# Patient Record
Sex: Female | Born: 1935 | Race: White | Hispanic: No | Smoking: Never smoker
Health system: Southern US, Community
[De-identification: ages and names within clinical notes are randomized; demographics above are authoritative.]

## PROBLEM LIST (undated history)

## (undated) DIAGNOSIS — Z8719 Personal history of other diseases of the digestive system: Secondary | ICD-10-CM

## (undated) DIAGNOSIS — IMO0002 Reserved for concepts with insufficient information to code with codable children: Secondary | ICD-10-CM

## (undated) DIAGNOSIS — R42 Dizziness and giddiness: Secondary | ICD-10-CM

## (undated) DIAGNOSIS — Z950 Presence of cardiac pacemaker: Secondary | ICD-10-CM

## (undated) DIAGNOSIS — E785 Hyperlipidemia, unspecified: Secondary | ICD-10-CM

## (undated) DIAGNOSIS — I10 Essential (primary) hypertension: Secondary | ICD-10-CM

## (undated) DIAGNOSIS — I4891 Unspecified atrial fibrillation: Secondary | ICD-10-CM

## (undated) DIAGNOSIS — M1990 Unspecified osteoarthritis, unspecified site: Secondary | ICD-10-CM

## (undated) DIAGNOSIS — K648 Other hemorrhoids: Secondary | ICD-10-CM

## (undated) DIAGNOSIS — I639 Cerebral infarction, unspecified: Secondary | ICD-10-CM

## (undated) DIAGNOSIS — D649 Anemia, unspecified: Secondary | ICD-10-CM

## (undated) DIAGNOSIS — M3500 Sicca syndrome, unspecified: Secondary | ICD-10-CM

## (undated) DIAGNOSIS — E559 Vitamin D deficiency, unspecified: Secondary | ICD-10-CM

## (undated) DIAGNOSIS — R55 Syncope and collapse: Secondary | ICD-10-CM

## (undated) DIAGNOSIS — I495 Sick sinus syndrome: Secondary | ICD-10-CM

## (undated) DIAGNOSIS — K219 Gastro-esophageal reflux disease without esophagitis: Secondary | ICD-10-CM

## (undated) DIAGNOSIS — E039 Hypothyroidism, unspecified: Secondary | ICD-10-CM

## (undated) DIAGNOSIS — Z9889 Other specified postprocedural states: Secondary | ICD-10-CM

## (undated) DIAGNOSIS — M810 Age-related osteoporosis without current pathological fracture: Secondary | ICD-10-CM

## (undated) DIAGNOSIS — G8929 Other chronic pain: Secondary | ICD-10-CM

## (undated) DIAGNOSIS — K2270 Barrett's esophagus without dysplasia: Secondary | ICD-10-CM

## (undated) HISTORY — PX: CHOLECYSTECTOMY: SHX55

## (undated) HISTORY — DX: Other hemorrhoids: K64.8

## (undated) HISTORY — PX: ABDOMINAL HYSTERECTOMY: SHX81

## (undated) HISTORY — DX: Vitamin D deficiency, unspecified: E55.9

## (undated) HISTORY — PX: KNEE ARTHROSCOPY: SUR90

## (undated) HISTORY — DX: Barrett's esophagus without dysplasia: K22.70

## (undated) HISTORY — DX: Age-related osteoporosis without current pathological fracture: M81.0

## (undated) HISTORY — DX: Dizziness and giddiness: R42

## (undated) HISTORY — DX: Sick sinus syndrome: I49.5

## (undated) HISTORY — PX: BILATERAL OOPHORECTOMY: SHX1221

## (undated) HISTORY — PX: EYE SURGERY: SHX253

## (undated) HISTORY — DX: Reserved for concepts with insufficient information to code with codable children: IMO0002

## (undated) HISTORY — DX: Hyperlipidemia, unspecified: E78.5

## (undated) HISTORY — DX: Presence of cardiac pacemaker: Z95.0

## (undated) HISTORY — DX: Unspecified atrial fibrillation: I48.91

## (undated) HISTORY — PX: TONSILLECTOMY: SUR1361

## (undated) HISTORY — PX: APPENDECTOMY: SHX54

## (undated) HISTORY — DX: Cerebral infarction, unspecified: I63.9

## (undated) HISTORY — DX: Other chronic pain: G89.29

## (undated) HISTORY — DX: Syncope and collapse: R55

---

## 2008-10-04 DIAGNOSIS — R55 Syncope and collapse: Secondary | ICD-10-CM

## 2008-10-04 HISTORY — DX: Syncope and collapse: R55

## 2008-10-04 HISTORY — PX: CARDIAC CATHETERIZATION: SHX172

## 2008-10-04 HISTORY — PX: PACEMAKER INSERTION: SHX728

## 2012-01-06 ENCOUNTER — Encounter: Payer: Self-pay | Admitting: Physical Medicine & Rehabilitation

## 2012-02-22 ENCOUNTER — Encounter: Payer: Medicare Other | Attending: Physical Medicine & Rehabilitation | Admitting: Physical Medicine & Rehabilitation

## 2012-02-22 ENCOUNTER — Encounter: Payer: Self-pay | Admitting: Physical Medicine & Rehabilitation

## 2012-02-22 VITALS — BP 168/80 | HR 72 | Resp 16 | Ht 60.0 in | Wt 152.0 lb

## 2012-02-22 DIAGNOSIS — M3500 Sicca syndrome, unspecified: Secondary | ICD-10-CM | POA: Insufficient documentation

## 2012-02-22 DIAGNOSIS — M19071 Primary osteoarthritis, right ankle and foot: Secondary | ICD-10-CM | POA: Insufficient documentation

## 2012-02-22 DIAGNOSIS — M545 Low back pain, unspecified: Secondary | ICD-10-CM | POA: Insufficient documentation

## 2012-02-22 DIAGNOSIS — M7918 Myalgia, other site: Secondary | ICD-10-CM | POA: Insufficient documentation

## 2012-02-22 DIAGNOSIS — M2170 Unequal limb length (acquired), unspecified site: Secondary | ICD-10-CM | POA: Insufficient documentation

## 2012-02-22 DIAGNOSIS — IMO0001 Reserved for inherently not codable concepts without codable children: Secondary | ICD-10-CM

## 2012-02-22 DIAGNOSIS — M47816 Spondylosis without myelopathy or radiculopathy, lumbar region: Secondary | ICD-10-CM

## 2012-02-22 DIAGNOSIS — G8929 Other chronic pain: Secondary | ICD-10-CM | POA: Insufficient documentation

## 2012-02-22 DIAGNOSIS — M76899 Other specified enthesopathies of unspecified lower limb, excluding foot: Secondary | ICD-10-CM

## 2012-02-22 DIAGNOSIS — M7060 Trochanteric bursitis, unspecified hip: Secondary | ICD-10-CM | POA: Insufficient documentation

## 2012-02-22 DIAGNOSIS — M19079 Primary osteoarthritis, unspecified ankle and foot: Secondary | ICD-10-CM

## 2012-02-22 DIAGNOSIS — M47817 Spondylosis without myelopathy or radiculopathy, lumbosacral region: Secondary | ICD-10-CM | POA: Insufficient documentation

## 2012-02-22 DIAGNOSIS — M19072 Primary osteoarthritis, left ankle and foot: Secondary | ICD-10-CM

## 2012-02-22 MED ORDER — DICLOFENAC SODIUM 1 % TD GEL: 1.0000 "application " | Freq: Four times a day (QID) | TRANSDERMAL | Status: DC

## 2012-02-22 NOTE — Progress Notes (Signed)
Subjective:    Patient ID: Tonya Hammond, female    DOB: Aug 25, 1927, 78 y.o.   MRN: [id number]  HPI  This is a pleasant 78 yo female who has a long history of low back pain.  She remembers that in her 20's she was found to have an abnormality in her low back. She was able to manage the sx conservatively with exercise, stretching etc until a 5 or 6 years ago.  Her last MRI was from about 5or6 years ago. She has had a CT scan apparently since then. She's had multiple back injections over the last few years. The last one she had was from 3 years ago.  They had to stop these because she had an anapyhlactic reeaction.   Her pain is in her left low back to the left buttock. It is a burning, popping, cracking sensation.  She has a hard time sleeping on that area, and that area sometimes is tender to touch. The area is painful when she flexes her right. Prolonged sitting makes the pain better.  Standing bothers it more than walking. She's limited in walking dx due to foot pain and OA. She's had buniectomy in the past. She reports OA in her ankle as well. She has had home therapy recently.   For relief, she uses ice, biofreeze, rest.  Lidoderm has helped in the past. She used a TENS unit in the past but it doesn't seem to work as well.  She likes warm water, and daily stretching (15 minutes in the am). She uses a Museum/gallery exhibitions officer for longer dx ambulation.   She is on hydrocodone every 6 hours at home. It does help with her pain but she needs it every 6 hours or she's in pain.  She's tried oxycodone but had intolerance.  Fentanyl caused breathing difficulties. She stopped cymbalta due to GI upset.   She does have a history of bradycardia and an arrhythmia. She had a pacer placed 3 years ago which has helped.  She was dx'ed with Sjogren's Syndrome in 1988. She's had arthopathy, dry membranes, eye complications as a result.   Her husband is at home under hospice care currently.     Pain Inventory Average Pain  5 Pain Right Now 5 My pain is sharp, stabbing and aching  In the last 24 hours, has pain interfered with the following? General activity 4 Relation with others 3 Enjoyment of life 4 What TIME of day is your pain at its worst? evening Sleep (in general) Good  Pain is worse with: walking, bending, sitting, inactivity and standing Pain improves with: rest, heat/ice, therapy/exercise and medication Relief from Meds: 7  Mobility walk with assistance use a cane use a walker how many minutes can you walk? 15 ability to climb steps?  yes do you drive?  no  Function not employed: date last employed 10/03/1982 I need assistance with the following:  meal prep, household duties and shopping  Neuro/Psych bladder control problems weakness trouble walking dizziness confusion depression anxiety  Prior Studies Any changes since last visit?  no NEW PATIENT  Physicians involved in your care Any changes since last visit?  no Primary care Dr Joycelyn Rua Dr Vilinda Boehringer      Review of Systems  Constitutional:       Poor appetite  Respiratory: Positive for shortness of breath.        Infections  Gastrointestinal: Positive for diarrhea and constipation.  Genitourinary: Positive for difficulty urinating.  Musculoskeletal: Positive for gait  problem.  Neurological: Positive for dizziness and weakness.  Psychiatric/Behavioral: Positive for confusion and dysphoric mood. The patient is nervous/anxious.   All other systems reviewed and are negative.       Objective:   Physical Exam  Constitutional: She is oriented to person, place, and time. She appears well-developed and well-nourished.  HENT:  Head: Atraumatic.  Eyes: Conjunctivae and EOM are normal. Pupils are equal, round, and reactive to light.  Cardiovascular: Normal rate, regular rhythm and normal heart sounds.   Pulmonary/Chest: Breath sounds normal. No respiratory distress. She has no wheezes.  Abdominal: Bowel  sounds are normal. She exhibits no distension. There is no tenderness.  Musculoskeletal:       Elevation of the left hemi pelvis over the right about by 3/4 inch.  Leg length discrepancy on the right was not equal to that and closer to 1/4 to 1/2 inch.    Mild dextroscoliosis of the lumbar-thoracic spine was seen.  She was quite tender to touch throuhout the lumbar paraspinals, psis areas, spinous processes, etc.  Greater trochanters were tender to touch especially on the right.   Posture was fair. She seem to be antalgic on the left leg.    Significant pes planus of right foot. Left foot arch is preserved. Toes are crossed on both feet, especially the 2nd one on the right and the 4th toe on the left.   Neurological: She is alert and oriented to person, place, and time. No cranial nerve deficit.       Pain inhibition weakness in the legs. Otherwise motor and sensory exam is normal  Psychiatric: She has a normal mood and affect. Her behavior is normal. Thought content normal.          Assessment & Plan:  1.Chronic low back pain related to spondylosis, myofascial pain, postural issues, right leg length discrepancy combined with pelvic rotation clock wise.  2. Sjogren's Syndrome  3. "Familial" spinal abnormality  4. Multiple medication allergies  5. 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 Outpatient therapy would be the best approach here. This is a cumulative problem, and there is no "cure" so to speak, but i think we can improve her quality of life. She needs to work on strengthening her core, balancing out her LE musculature, pelvis, and low back as well.  I think it would be helpful to find some modalities which provide pain relief also.  She needs to address a home exercise program also. 2. Trial of voltaren gel to her hips, feet, low back. 3. Advised her to resume the wear of a 1/4 heel insert on the right shoe. I do think that pelvic  rotation is playing a part in her clinical discrepancy as well. 4. She may continue with hydrocodone for pain. She is using q6 hours. Consider the use of a long acting agent.  A UDS sample was collected today per protocol. 5. All questions were encouraged and answered. The patient will see me back in one month.  Need to acquire files from [facility]

## 2012-02-22 NOTE — Patient Instructions (Signed)
Continue range of motion and stretching on a daily basis

## 2012-03-03 ENCOUNTER — Telehealth: Payer: Self-pay

## 2012-03-03 NOTE — Telephone Encounter (Signed)
Pt would like to know status of prior auth for Voltaren gel.

## 2012-03-06 NOTE — Telephone Encounter (Signed)
Prior Tonya Hammond has been initiated, pt aware.

## 2012-03-22 ENCOUNTER — Telehealth: Payer: Self-pay | Admitting: Physical Medicine & Rehabilitation

## 2012-03-22 MED ORDER — HYDROCODONE-ACETAMINOPHEN 7.5-325 MG PO TABS: 1.0000 | ORAL_TABLET | Freq: Four times a day (QID) | ORAL | Status: DC | PRN

## 2012-03-22 NOTE — Telephone Encounter (Signed)
Had to reschedule to 03/29/12, wants meds changed, but only has enough to get her through Saturday.

## 2012-03-22 NOTE — Telephone Encounter (Signed)
Pt is need a refill on Hydrocodone. Made pt aware that we don't make changes over the phone to medications, I will call in her current dose of Hydrocodone in. UDS was consistent.

## 2012-03-22 NOTE — Telephone Encounter (Signed)
Tried calling pt, didn't get an answer.

## 2012-03-24 ENCOUNTER — Ambulatory Visit: Payer: Self-pay | Admitting: Physical Medicine & Rehabilitation

## 2012-03-29 ENCOUNTER — Encounter: Payer: Medicare Other | Attending: Physical Medicine & Rehabilitation | Admitting: Physical Medicine & Rehabilitation

## 2012-03-29 ENCOUNTER — Encounter: Payer: Self-pay | Admitting: Physical Medicine & Rehabilitation

## 2012-03-29 VITALS — BP 130/50 | HR 78 | Resp 14 | Ht 60.0 in | Wt 150.0 lb

## 2012-03-29 DIAGNOSIS — IMO0001 Reserved for inherently not codable concepts without codable children: Secondary | ICD-10-CM | POA: Insufficient documentation

## 2012-03-29 DIAGNOSIS — M3500 Sicca syndrome, unspecified: Secondary | ICD-10-CM | POA: Insufficient documentation

## 2012-03-29 DIAGNOSIS — M47816 Spondylosis without myelopathy or radiculopathy, lumbar region: Secondary | ICD-10-CM

## 2012-03-29 DIAGNOSIS — M7060 Trochanteric bursitis, unspecified hip: Secondary | ICD-10-CM

## 2012-03-29 DIAGNOSIS — M47817 Spondylosis without myelopathy or radiculopathy, lumbosacral region: Secondary | ICD-10-CM

## 2012-03-29 DIAGNOSIS — M76899 Other specified enthesopathies of unspecified lower limb, excluding foot: Secondary | ICD-10-CM | POA: Insufficient documentation

## 2012-03-29 DIAGNOSIS — M7918 Myalgia, other site: Secondary | ICD-10-CM

## 2012-03-29 DIAGNOSIS — M19079 Primary osteoarthritis, unspecified ankle and foot: Secondary | ICD-10-CM | POA: Insufficient documentation

## 2012-03-29 DIAGNOSIS — M19071 Primary osteoarthritis, right ankle and foot: Secondary | ICD-10-CM

## 2012-03-29 MED ORDER — HYDROCODONE-ACETAMINOPHEN 7.5-325 MG PO TABS: 1.0000 | ORAL_TABLET | Freq: Four times a day (QID) | ORAL | Status: DC | PRN

## 2012-03-29 MED ORDER — MORPHINE SULFATE ER 15 MG PO TBCR: 15.0000 mg | EXTENDED_RELEASE_TABLET | Freq: Two times a day (BID) | ORAL | Status: DC

## 2012-03-29 NOTE — Patient Instructions (Signed)
PLEASE TRY ONLY ONE MORPHINE CONTROLLED RELEASE TABLET DAILY FOR THE FIRST WEEK TO MAKE SURE YOU CAN TOLERATE IT  CONTINUE WITH STRETCHING, GOOD POSTURE, AND EXERCISE AT HOME!

## 2012-03-29 NOTE — Progress Notes (Signed)
Subjective:    Patient ID: Tonya Hammond, female    DOB: 09/28/1927, 78 y.o.   MRN: [id number]  HPI The patient is back regarding her low back pain. She states she was a bit frustrated as I didn't make any changes to her medication regimen at last visit. We did attempt to prescribe her Voltaren gel, but insurance would not cover this. She did not appeal this. We also did not prescribe narcotics per our protocol and first visit.  Patient did go to physical therapy to work on lumbar range of motion strength lengthening and modalities. This seems to have been quite beneficial for her, as she's had improved with mobility and really pain control overall. She also found it beneficial to use the right shoe lift of 1/4 inches.  She remains on her hydrocodone 7.5/325 one 4 times a day. This seems to be the only thing that she can tolerate and that helps her pain. She tells me that she's tried multiple medications in the past including oral NSAIDs, oxycodone, fentanyl patch. She has been unable to tolerate any of these due to side effects.   Pain Inventory Average Pain 3 Pain Right Now 3 My pain is constant, burning, stabbing and aching  In the last 24 hours, has pain interfered with the following? General activity 5 Relation with others 5 Enjoyment of life 5 What TIME of day is your pain at its worst? evening Sleep (in general) Fair  Pain is worse with: bending, inactivity and standing Pain improves with: heat/ice and medication Relief from Meds: 5  Mobility use a cane use a walker how many minutes can you walk? 10 ability to climb steps?  yes do you drive?  no  Function retired I need assistance with the following:  meal prep, household duties and shopping  Neuro/Psych bladder control problems weakness trouble walking spasms dizziness confusion anxiety  Prior Studies Any changes since last visit?  no  Physicians involved in your care Any changes since last visit?   no   Family History  Problem Relation Age of Onset  . Heart disease Mother     CHF  . Depression Mother   . Arthritis Mother   . Cancer Father   . Alcohol abuse Father   . Heart disease Father   . Hypertension Father   . Arthritis Father   . Cancer Sister   . Hypertension Sister   . Stroke Paternal Grandmother    History   Social History  . Marital Status: Unknown    Spouse Name: N/A    Number of Children: N/A  . Years of Education: N/A   Social History Main Topics  . Smoking status: Never Smoker   . Smokeless tobacco: Never Used  . Alcohol Use: None  . Drug Use: None  . Sexually Active: None   Other Topics Concern  . None   Social History Narrative  . None   Past Surgical History  Procedure Date  . Appendectomy   . Knee arthroscopy   . Eye surgery   . Cholecystectomy   . Abdominal hysterectomy   . Tonsillectomy    History reviewed. No pertinent past medical history. BP 130/50  Pulse 78  Resp 14  Ht 5' (1.524 m)  Wt 150 lb (68.04 kg)  BMI 29.30 kg/m2  SpO2 97%      Review of Systems  Constitutional: Positive for chills and fatigue.  HENT: Negative.   Eyes: Negative.   Respiratory: Negative.   Cardiovascular: Negative.  Gastrointestinal: Positive for nausea, diarrhea and constipation.  Genitourinary: Negative.   Musculoskeletal: Negative.   Skin: Positive for rash.  Neurological: Negative.   Hematological: Bruises/bleeds easily.  Psychiatric/Behavioral: Positive for confusion.       Anxiety       Objective:   Physical Exam Constitutional: She is oriented to person, place, and time. She appears well-developed and well-nourished.  HENT:  Head: Atraumatic.  Eyes: Conjunctivae and EOM are normal. Pupils are equal, round, and reactive to light.  Cardiovascular: Normal rate, regular rhythm and normal heart sounds.  Pulmonary/Chest: Breath sounds normal. No respiratory distress. She has no wheezes.  Abdominal: Bowel sounds are normal. She  exhibits no distension. There is no tenderness.  Musculoskeletal:  Elevation of the left hemi pelvis over the right about by 3/4 inch. Leg length discrepancy on the right was not equal to that and closer to 1/4 to 1/2 inch. When she wore her shoe lift today her pelvis was much more symmetrical.   Mild dextroscoliosis of the lumbar-thoracic spine was seen.  She was less tender to touch throuhout the lumbar paraspinals, psis areas, spinous processes, etc. Greater trochanters were tender to touch especially on the right. The patient was able to bend to 90 with minimal discomfort. She had some discomfort with lateral bending and twisting to the right today. Overall her flexibility and range of motion was much improved. At that she stood up much more easily from the chair as well.   Posture was fair. She seem to be antalgic on the left leg to a certain extent.   Significant pes planus of right foot. Left foot arch is preserved. Toes are crossed on both feet, especially the 2nd one on the right and the 4th toe on the left.  Neurological: She is alert and oriented to person, place, and time. No cranial nerve deficit.    motor and sensory exam is unremarkable.  Psychiatric: She has a normal mood and affect. Her behavior is normal. Thought content normal.  Assessment & Plan:   1.Chronic low back pain related to spondylosis, myofascial pain, postural issues, right leg length discrepancy combined with pelvic rotation clock wise.  2. Sjogren's Syndrome  3. "Familial" spinal abnormality  4. Multiple medication allergies  5. 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 I understand that the patient has a lot of issues at home concerning her health of her husband and other family members. She feels that it's a bit of a hardship to continue going to physical therapy and that she has learned what she needs to learn. She has felt that therapy has been quite beneficial. I  expressed to her the importance of continuing with a regular home exercise program to continue building strength and to maintain length and posture as well. 2. Trial of voltaren gel to her hips, feet, low back. We will attempt to appeal the denial of her Voltaren gel as she is unable to tolerate any oral NSAIDs given her history of allergies as well as medical history. 3. Continue to wear a 1/4 heel insert on the right shoe.  4. She may continue with hydrocodone for pain. She is using q6 hours. 5. Will begin a trial of low dose MS Contin. Begin at 15mg  qday for one week and increase to q12 if needed and tolerated. 6. All questions were encouraged and answered. The patient will see me back in one month. I think overall the patient  has progressed since last month.

## 2012-04-28 ENCOUNTER — Encounter: Payer: Medicare Other | Attending: Physical Medicine & Rehabilitation | Admitting: Physical Medicine & Rehabilitation

## 2012-04-28 ENCOUNTER — Encounter: Payer: Self-pay | Admitting: Physical Medicine & Rehabilitation

## 2012-04-28 VITALS — BP 149/70 | HR 67 | Resp 16 | Ht 60.0 in | Wt 154.0 lb

## 2012-04-28 DIAGNOSIS — M47817 Spondylosis without myelopathy or radiculopathy, lumbosacral region: Secondary | ICD-10-CM | POA: Insufficient documentation

## 2012-04-28 DIAGNOSIS — M47816 Spondylosis without myelopathy or radiculopathy, lumbar region: Secondary | ICD-10-CM

## 2012-04-28 DIAGNOSIS — M19071 Primary osteoarthritis, right ankle and foot: Secondary | ICD-10-CM

## 2012-04-28 DIAGNOSIS — IMO0001 Reserved for inherently not codable concepts without codable children: Secondary | ICD-10-CM | POA: Insufficient documentation

## 2012-04-28 DIAGNOSIS — M7060 Trochanteric bursitis, unspecified hip: Secondary | ICD-10-CM

## 2012-04-28 DIAGNOSIS — M7918 Myalgia, other site: Secondary | ICD-10-CM

## 2012-04-28 DIAGNOSIS — M19079 Primary osteoarthritis, unspecified ankle and foot: Secondary | ICD-10-CM | POA: Insufficient documentation

## 2012-04-28 DIAGNOSIS — M3500 Sicca syndrome, unspecified: Secondary | ICD-10-CM | POA: Insufficient documentation

## 2012-04-28 DIAGNOSIS — M76899 Other specified enthesopathies of unspecified lower limb, excluding foot: Secondary | ICD-10-CM | POA: Insufficient documentation

## 2012-04-28 MED ORDER — MORPHINE SULFATE ER 15 MG PO TBCR: 15.0000 mg | EXTENDED_RELEASE_TABLET | Freq: Two times a day (BID) | ORAL | Status: DC

## 2012-04-28 NOTE — Patient Instructions (Signed)
CONTINUE WITH YOUR HOME EXERCISE PROGRAM  TRY 500-1000MG  OF ACETAMINOPHEN AT NIGHT FOR BREAKTHROUGH PAIN  IF YOUR PAIN IS MORE SEVERE AT NIGHT, TRY 1/2 TO 1 TAB OF HYDROCODONE  CALL ME WITH ANY QUESTIONS

## 2012-04-28 NOTE — Progress Notes (Signed)
Subjective:    Patient ID: Tonya Hammond, female    DOB: [birth year], 78 y.o.   MRN: [id number]  HPI  Tonya Hammond is back regarding her low back pain. She had a fall about a week and half ago when she was walking in her yard while some septic work was being done. It sounds as if her dog ran underneath of her and she fell against the steps. She was taken to the doctor and fortunately only suffered bruises and lacerations. Her pain has been a little more severe since the fall but is still improved from where it was a couple months ago.  We started her on Tonya Hammond 15 q12 at last visit. She initially tried once a day dosing, but after one week increased to q12. This seemed to help quite a bit. She has used tylenol or norco for breakthrough pain at night. She prefers not to take the norco, because it tends to make her groggy. The tylenol, however, hasn't always beenenough to hold the pain since she fell however.   She continues with her exercises and she feels that the strength she gained from doing these helped her a lot after she fell.   Pain Inventory Average Pain 4 Pain Right Now 4 My pain is intermittent, sharp, burning and aching  In the last 24 hours, has pain interfered with the following? General activity 4 Relation with others 5 Enjoyment of life 6 What TIME of day is your pain at its worst? morning and evening Sleep (in general) Fair  Pain is worse with: walking, bending and standing Pain improves with: heat/ice, therapy/exercise, pacing activities and medication Relief from Meds: 5  Mobility walk with assistance use a walker how many minutes can you walk? 15 ability to climb steps?  yes do you drive?  no Do you have any goals in this area?  no  Function retired I need assistance with the following:  meal prep, household duties and shopping  Neuro/Psych bladder control problems weakness numbness dizziness confusion anxiety  Prior Studies Any changes since  last visit?  no  Physicians involved in your care Any changes since last visit?  no   Family History  Problem Relation Age of Onset  . Heart disease Mother     CHF  . Depression Mother   . Arthritis Mother   . Cancer Father   . Alcohol abuse Father   . Heart disease Father   . Hypertension Father   . Arthritis Father   . Cancer Sister   . Hypertension Sister   . Stroke Paternal Grandmother    History   Social History  . Marital Status: Unknown    Spouse Name: N/A    Number of Children: N/A  . Years of Education: N/A   Social History Main Topics  . Smoking status: Never Smoker   . Smokeless tobacco: Never Used  . Alcohol Use: None  . Drug Use: None  . Sexually Active: None   Other Topics Concern  . None   Social History Narrative  . None   Past Surgical History  Procedure Date  . Appendectomy   . Knee arthroscopy   . Eye surgery   . Cholecystectomy   . Abdominal hysterectomy   . Tonsillectomy    History reviewed. No pertinent past medical history. BP 149/70  Pulse 67  Resp 16  Ht 5' (1.524 m)  Wt 154 lb (69.854 kg)  BMI 30.08 kg/m2  SpO2 99%  Review of Systems  Respiratory: Positive for shortness of breath.   Gastrointestinal: Positive for constipation.  Musculoskeletal: Positive for myalgias, back pain and arthralgias.  Neurological: Positive for dizziness, weakness and numbness.  Psychiatric/Behavioral: The patient is nervous/anxious.   All other systems reviewed and are negative.       Objective:   Physical Exam Constitutional: She is oriented to person, place, and time. She appears well-developed and well-nourished.  HENT:  Head: Atraumatic.  Eyes: Conjunctivae and EOM are normal. Pupils are equal, round, and reactive to light.  Cardiovascular: Normal rate, regular rhythm and normal heart sounds.  Pulmonary/Chest: Breath sounds normal. No respiratory distress. She has no wheezes.  Abdominal: Bowel sounds are normal. She exhibits  no distension. There is no tenderness.  Musculoskeletal:  Elevation of the left hemi pelvis over the right about by 3/4 inch. Leg length discrepancy on the right was not equal to that and closer to 1/4 to 1/2 inch. When she wore her shoe lift today her pelvis was much more symmetrical.   Mild dextroscoliosis of the lumbar-thoracic spine was seen.  She was less tender to touch throuhout the lumbar paraspinals, psis areas, spinous processes, etc. Greater trochanters were tender to touch especially on the right. The patient was able to bend to 90 with minimal discomfort. She had some discomfort with lateral bending and twisting to the right today. Overall her flexibility and range of motion was much improved. At that she stood up much more easily from the chair as well.   Posture was fair. She seem to be antalgic on the left leg to a certain extent.  She does occasionally lose her balance posteriorly. She walks quite nicely with the walker.   Significant pes planus of right foot. Left foot arch is preserved. Toes are crossed on both feet, especially the 2nd one on the right and the 4th toe on the left.  Neurological: She is alert and oriented to person, place, and time. No cranial nerve deficit.  motor and sensory exam is unremarkable.  Psychiatric: She has a normal mood and affect. Her behavior is normal. Thought content normal.  Skin: notable for multiple bruises on the left arm, right arm and legs. She has a laceration on the right forearm which is dressed.  Assessment & Plan:   1.Chronic low back pain related to spondylosis, myofascial pain, postural issues, right leg length discrepancy combined with pelvic rotation clock wise.  2. Sjogren's Syndrome  3. "Familial" spinal abnormality  4. Multiple medication allergies  5. 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 Overall, Tonya Hammond has done quite nicely with the Tonya Hammond. I think it would be most  appropriate to stay at the 15mg  q12 hr dosing schedule.  I recommended trying 500-1000mg  of tylenol in the evenings if she needs it for breakthrough pain. She may use a 1/2 to 1 norco 7.5 if her pain is more severe. She was feeling quite well until she had the fall, and even after the fall is feeling better than she had previously.  2. Encouraged her to continue with her ROM, Strengthening, and postural exercises. In my opinion these are the key to her maintaining adequate control of her pain as well as her balance. 3. She is quite concerned over the cost and inconvenience of coming to our clinic for visits. She would prefer at this point to have her family physician, Dr. Silvestre Moment, take over her pain  medication. I told her this would be fine, as long as he was ok with assuming responsibilities of these meds. 4. For now, I will see her back on an as needed basis.  All questions were encouraged and answered.

## 2012-10-19 ENCOUNTER — Encounter (HOSPITAL_COMMUNITY): Payer: Self-pay | Admitting: Internal Medicine

## 2012-10-19 ENCOUNTER — Emergency Department (HOSPITAL_COMMUNITY): Payer: Medicare Other

## 2012-10-19 ENCOUNTER — Observation Stay (HOSPITAL_COMMUNITY)
Admission: EM | Admit: 2012-10-19 | Discharge: 2012-10-20 | Disposition: A | Discharge status: Home / Self Care | Payer: Medicare Other | Attending: Cardiology | Admitting: Cardiology

## 2012-10-19 DIAGNOSIS — I10 Essential (primary) hypertension: Secondary | ICD-10-CM | POA: Diagnosis present

## 2012-10-19 DIAGNOSIS — IMO0002 Reserved for concepts with insufficient information to code with codable children: Secondary | ICD-10-CM | POA: Insufficient documentation

## 2012-10-19 DIAGNOSIS — R55 Syncope and collapse: Secondary | ICD-10-CM

## 2012-10-19 DIAGNOSIS — Z79899 Other long term (current) drug therapy: Secondary | ICD-10-CM | POA: Insufficient documentation

## 2012-10-19 DIAGNOSIS — M4120 Other idiopathic scoliosis, site unspecified: Secondary | ICD-10-CM | POA: Insufficient documentation

## 2012-10-19 DIAGNOSIS — Z950 Presence of cardiac pacemaker: Secondary | ICD-10-CM | POA: Insufficient documentation

## 2012-10-19 DIAGNOSIS — K219 Gastro-esophageal reflux disease without esophagitis: Secondary | ICD-10-CM | POA: Insufficient documentation

## 2012-10-19 DIAGNOSIS — M7060 Trochanteric bursitis, unspecified hip: Secondary | ICD-10-CM

## 2012-10-19 DIAGNOSIS — E039 Hypothyroidism, unspecified: Secondary | ICD-10-CM | POA: Insufficient documentation

## 2012-10-19 DIAGNOSIS — M19071 Primary osteoarthritis, right ankle and foot: Secondary | ICD-10-CM

## 2012-10-19 DIAGNOSIS — I369 Nonrheumatic tricuspid valve disorder, unspecified: Secondary | ICD-10-CM

## 2012-10-19 DIAGNOSIS — R0602 Shortness of breath: Secondary | ICD-10-CM | POA: Insufficient documentation

## 2012-10-19 DIAGNOSIS — M7918 Myalgia, other site: Secondary | ICD-10-CM

## 2012-10-19 DIAGNOSIS — R079 Chest pain, unspecified: Principal | ICD-10-CM | POA: Insufficient documentation

## 2012-10-19 DIAGNOSIS — M3500 Sicca syndrome, unspecified: Secondary | ICD-10-CM | POA: Insufficient documentation

## 2012-10-19 DIAGNOSIS — K449 Diaphragmatic hernia without obstruction or gangrene: Secondary | ICD-10-CM | POA: Insufficient documentation

## 2012-10-19 DIAGNOSIS — M47816 Spondylosis without myelopathy or radiculopathy, lumbar region: Secondary | ICD-10-CM

## 2012-10-19 DIAGNOSIS — M129 Arthropathy, unspecified: Secondary | ICD-10-CM | POA: Insufficient documentation

## 2012-10-19 HISTORY — DX: Gastro-esophageal reflux disease without esophagitis: K21.9

## 2012-10-19 HISTORY — DX: Unspecified osteoarthritis, unspecified site: M19.90

## 2012-10-19 HISTORY — DX: Anemia, unspecified: D64.9

## 2012-10-19 HISTORY — DX: Sjogren syndrome, unspecified: M35.00

## 2012-10-19 HISTORY — DX: Essential (primary) hypertension: I10

## 2012-10-19 HISTORY — DX: Hypothyroidism, unspecified: E03.9

## 2012-10-19 HISTORY — DX: Personal history of other diseases of the digestive system: Z87.19

## 2012-10-19 HISTORY — DX: Other specified postprocedural states: Z98.890

## 2012-10-19 LAB — POCT I-STAT, CHEM 8
BUN: 20 mg/dL (ref 6–23)
Creatinine, Ser: 0.9 mg/dL (ref 0.50–1.10)
Potassium: 3.8 mEq/L (ref 3.5–5.1)
Sodium: 142 mEq/L (ref 135–145)
TCO2: 30 mmol/L (ref 0–100)

## 2012-10-19 LAB — LIPID PANEL
Cholesterol: 161 mg/dL (ref 0–200)
HDL: 63 mg/dL (ref >39)
Total CHOL/HDL Ratio: 2.6 RATIO

## 2012-10-19 LAB — CBC
Hemoglobin: 12.7 g/dL (ref 12.0–15.0)
Platelets: 154 10*3/uL (ref 150–400)
RBC: 4.08 MIL/uL (ref 3.87–5.11)
WBC: 6 10*3/uL (ref 4.0–10.5)

## 2012-10-19 LAB — TSH: TSH: 2.929 u[IU]/mL (ref 0.350–4.500)

## 2012-10-19 LAB — BASIC METABOLIC PANEL
CO2: 29 mEq/L (ref 19–32)
Calcium: 8.9 mg/dL (ref 8.4–10.5)
Chloride: 104 mEq/L (ref 96–112)
Glucose, Bld: 102 mg/dL — ABNORMAL HIGH (ref 70–99)
Potassium: 4 mEq/L (ref 3.5–5.1)
Sodium: 141 mEq/L (ref 135–145)

## 2012-10-19 LAB — PRO B NATRIURETIC PEPTIDE: Pro B Natriuretic peptide (BNP): 659.7 pg/mL — ABNORMAL HIGH (ref 0–450)

## 2012-10-19 LAB — GLUCOSE, CAPILLARY: Glucose-Capillary: 84 mg/dL (ref 70–99)

## 2012-10-19 MED ORDER — ENOXAPARIN SODIUM 40 MG/0.4ML ~~LOC~~ SOLN
40.0000 mg | Freq: Every day | SUBCUTANEOUS | Status: DC
  Administered 2012-10-19: 40 mg via SUBCUTANEOUS
  Filled 2012-10-19 (×2): qty 0.4

## 2012-10-19 MED ORDER — OMEGA-3 FATTY ACIDS 1000 MG PO CAPS: 1.0000 g | ORAL_CAPSULE | Freq: Every day | ORAL | Status: DC

## 2012-10-19 MED ORDER — LEVOTHYROXINE SODIUM 25 MCG PO TABS
25.0000 ug | ORAL_TABLET | Freq: Every day | ORAL | Status: DC
  Administered 2012-10-19 – 2012-10-20 (×2): 25 ug via ORAL
  Filled 2012-10-19 (×4): qty 1

## 2012-10-19 MED ORDER — ADULT MULTIVITAMIN W/MINERALS CH
1.0000 | ORAL_TABLET | Freq: Every day | ORAL | Status: DC
  Administered 2012-10-19 – 2012-10-20 (×2): 1 via ORAL
  Filled 2012-10-19 (×2): qty 1

## 2012-10-19 MED ORDER — LIDOCAINE 5 % EX PTCH
1.0000 | MEDICATED_PATCH | CUTANEOUS | Status: DC
  Administered 2012-10-20: 1 via TRANSDERMAL
  Filled 2012-10-19 (×2): qty 1

## 2012-10-19 MED ORDER — HYDROCODONE-ACETAMINOPHEN 7.5-325 MG PO TABS
1.0000 | ORAL_TABLET | Freq: Four times a day (QID) | ORAL | Status: DC | PRN
  Administered 2012-10-19 – 2012-10-20 (×2): 1 via ORAL
  Filled 2012-10-19 (×2): qty 1

## 2012-10-19 MED ORDER — ONDANSETRON HCL 4 MG/2ML IJ SOLN: 4.0000 mg | Freq: Four times a day (QID) | INTRAMUSCULAR | Status: DC | PRN

## 2012-10-19 MED ORDER — SENNA 8.6 MG PO TABS
2.0000 | ORAL_TABLET | Freq: Every day | ORAL | Status: DC
  Administered 2012-10-19: 17.2 mg via ORAL
  Filled 2012-10-19 (×2): qty 2

## 2012-10-19 MED ORDER — METOPROLOL SUCCINATE ER 50 MG PO TB24
50.0000 mg | ORAL_TABLET | Freq: Every day | ORAL | Status: DC
  Administered 2012-10-19 – 2012-10-20 (×2): 50 mg via ORAL
  Filled 2012-10-19 (×2): qty 1

## 2012-10-19 MED ORDER — PANTOPRAZOLE SODIUM 40 MG PO TBEC
40.0000 mg | DELAYED_RELEASE_TABLET | Freq: Every day | ORAL | Status: DC
  Administered 2012-10-19 – 2012-10-20 (×2): 40 mg via ORAL
  Filled 2012-10-19 (×2): qty 1

## 2012-10-19 MED ORDER — NITROGLYCERIN 0.4 MG SL SUBL: 0.4000 mg | SUBLINGUAL_TABLET | SUBLINGUAL | Status: DC | PRN

## 2012-10-19 MED ORDER — ALBUTEROL SULFATE HFA 108 (90 BASE) MCG/ACT IN AERS: 1.0000 | INHALATION_SPRAY | Freq: Four times a day (QID) | RESPIRATORY_TRACT | Status: DC | PRN

## 2012-10-19 MED ORDER — MORPHINE SULFATE ER 15 MG PO TBCR
15.0000 mg | EXTENDED_RELEASE_TABLET | Freq: Two times a day (BID) | ORAL | Status: DC
  Administered 2012-10-19 – 2012-10-20 (×3): 15 mg via ORAL
  Filled 2012-10-19 (×3): qty 1

## 2012-10-19 MED ORDER — RISAQUAD PO CAPS
1.0000 | ORAL_CAPSULE | Freq: Every day | ORAL | Status: DC
  Administered 2012-10-19 – 2012-10-20 (×2): 1 via ORAL
  Filled 2012-10-19 (×2): qty 1

## 2012-10-19 MED ORDER — PREDNISONE 5 MG PO TABS
5.0000 mg | ORAL_TABLET | Freq: Every day | ORAL | Status: DC
  Administered 2012-10-19 – 2012-10-20 (×2): 5 mg via ORAL
  Filled 2012-10-19 (×3): qty 1

## 2012-10-19 MED ORDER — ISOSORBIDE MONONITRATE ER 30 MG PO TB24
30.0000 mg | ORAL_TABLET | Freq: Every day | ORAL | Status: DC
  Administered 2012-10-19: 30 mg via ORAL
  Filled 2012-10-19 (×2): qty 1

## 2012-10-19 MED ORDER — ESTROGENS CONJUGATED 0.3 MG PO TABS
0.3000 mg | ORAL_TABLET | Freq: Every day | ORAL | Status: DC
  Administered 2012-10-19 – 2012-10-20 (×2): 0.3 mg via ORAL
  Filled 2012-10-19 (×2): qty 1

## 2012-10-19 MED ORDER — CLOPIDOGREL BISULFATE 75 MG PO TABS
75.0000 mg | ORAL_TABLET | Freq: Every day | ORAL | Status: DC
  Administered 2012-10-20: 75 mg via ORAL
  Filled 2012-10-19: qty 1

## 2012-10-19 MED ORDER — NITROGLYCERIN 0.4 MG SL SUBL
0.4000 mg | SUBLINGUAL_TABLET | SUBLINGUAL | Status: DC | PRN
  Administered 2012-10-19 (×2): 0.4 mg via SUBLINGUAL

## 2012-10-19 MED ORDER — OMEGA-3-ACID ETHYL ESTERS 1 G PO CAPS
1.0000 g | ORAL_CAPSULE | Freq: Every day | ORAL | Status: DC
  Administered 2012-10-20: 1 g via ORAL
  Filled 2012-10-19 (×2): qty 1

## 2012-10-19 MED ORDER — DOCUSATE SODIUM 100 MG PO CAPS
200.0000 mg | ORAL_CAPSULE | Freq: Every day | ORAL | Status: DC
  Administered 2012-10-19: 200 mg via ORAL
  Filled 2012-10-19 (×2): qty 2

## 2012-10-19 NOTE — ED Notes (Addendum)
Patient transported by [facility] from home.  Patient got up to go to bathroom and had a near syncopal episode.  States she was feeling weak.  Denies any loss of consciousness.  Patient has a demand pacemaker and a history of afib. Patient's EKG was initially NSR of 90 changed to show paced rhythm at 60 upon arrival to hospital.  CBG 104.   Patient currently states she feels funny in her head.

## 2012-10-19 NOTE — H&P (Signed)
Cardiology H&P  Primary Care Povider: Joycelyn Rua, MD Primary Cardiologist: Dr. Leeann Must with [facility] in [place]   HPI: [name] is a 79 y.o.female with HTN and h/o pacemaker for ?SSS who presents to the ED for an episode of CP and SOB that woke her from sleep.  He daughter says she called out for her and was somewhat confused, diaphoretic and very short of breath.  Her husband has a pulse ox and they put it on her to find her O2 sats in the mid 80s.  She used her husbands oxygen with improvement in her symptoms.  EMS was called and she was brought to the ED.  Her pain was eventually completely releived with 2 SL nitro.  Her initial pressure was in the 220s/100s via EMS per the daughter.  She is now feeling much better.  She has multple drug allergies and ASA gives her a rash.  She has had no fever or chills.  She has had no syncope but did feel dizzy during this episode tonight. I was called to evaluate due to concerning findings of possible ST elevation on rhythm strip from EMS.   Past Medical History  Diagnosis Date  . Pacemaker 2010    syncope  . Scoliosis   . Hypothyroidism   . HTN (hypertension)   . Sjogren's disease     Past Surgical History  Procedure Date  . Appendectomy   . Knee arthroscopy   . Eye surgery   . Cholecystectomy   . Abdominal hysterectomy   . Tonsillectomy     Family History  Problem Relation Age of Onset  . Heart disease Mother 41    CHF  . Depression Mother   . Arthritis Mother   . Cancer Father   . Alcohol abuse Father   . Heart disease Father   . Hypertension Father   . Arthritis Father   . Cancer Sister   . Hypertension Sister   . Stroke Paternal Grandmother     Social History:  reports that she has never smoked. She has never used smokeless tobacco. She reports that she does not drink alcohol. Her drug history not on file.  Allergies:  Allergies  Allergen Reactions  . Ace Inhibitors   . Amlodipine Anaphylaxis  .  Angiotensin Receptor Blockers   . Avelox (Moxifloxacin Hcl In Nacl)   . Azor (Amlodipine-Olmesartan)   . Cephalexin   . Ciprofloxacin Hcl   . Contrast Media (Iodinated Diagnostic Agents) Anaphylaxis  . Cortizone-10 (Hydrocortisone)     Can take prednisone  . Cymbalta (Duloxetine Hcl)   . Depo-Medrol (Methylprednisolone Acetate)     Can take prednisone  . Flagyl (Metronidazole)   . Penicillins   . Zantac (Ranitidine Hcl)   . Aspirin Rash  . Fentanyl Citrate   . Omnaris (Ciclesonide)   . Statins   . Wellbutrin (Bupropion) Hives    Current Facility-Administered Medications  Medication Dose Route Frequency Provider Last Rate Last Dose  . nitroGLYCERIN (NITROSTAT) SL tablet 0.4 mg  0.4 mg Sublingual Q5 min PRN April K Palumbo-Rasch, MD   0.4 mg at 10/19/12 1610   Current Outpatient Prescriptions  Medication Sig Dispense Refill  . acetaminophen (TYLENOL) 500 MG tablet Take 500 mg by mouth every 6 (six) hours as needed. But not with Norco      . albuterol (PROVENTIL HFA;VENTOLIN HFA) 108 (90 BASE) MCG/ACT inhaler Inhale 1-2 puffs into the lungs every 6 (six) hours as needed. Wheezing or shortness of breath      .  beta carotene w/minerals (OCUVITE) tablet Take 1 tablet by mouth daily.      . Calcium Citrate-Vitamin D (CITRACAL PETITES/VITAMIN D) 200-250 MG-UNIT TABS Take 2 tablets by mouth daily.      . cetirizine (ZYRTEC) 10 MG chewable tablet Chew 10 mg by mouth daily.      . chlorpheniramine (CHLOR-TRIMETON) 4 MG tablet Take 4 mg by mouth every 6 (six) hours as needed. For sinus congestion      . docusate sodium (COLACE) 100 MG capsule Take 200 mg by mouth at bedtime.       Marland Kitchen estrogens, conjugated, (PREMARIN) 0.3 MG tablet Take 0.3 mg by mouth daily.      . fish oil-omega-3 fatty acids 1000 MG capsule Take 1 g by mouth daily.      . furosemide (LASIX) 20 MG tablet Take 20 mg by mouth 2 (two) times daily as needed.      Marland Kitchen HYDROcodone-acetaminophen (NORCO) 7.5-325 MG per tablet Take 1  tablet by mouth every 6 (six) hours as needed.  120 tablet  0  . isosorbide mononitrate (IMDUR) 30 MG 24 hr tablet Take 1 tablet by mouth at bedtime.       Marland Kitchen levothyroxine (SYNTHROID, LEVOTHROID) 25 MCG tablet Take 25 mcg by mouth daily.      Marland Kitchen lidocaine (LIDODERM) 5 % Place 1 patch onto the skin daily. Remove & Discard patch within 12 hours or as directed by MD      . loratadine (CLARITIN) 10 MG tablet Take 10 mg by mouth daily.      . Menthol, Topical Analgesic, (BIOFREEZE) 4 % GEL Apply 1 application topically 4 (four) times daily as needed. Joint pain      . morphine (MS CONTIN) 15 MG 12 hr tablet Take 1 tablet (15 mg total) by mouth 2 (two) times daily.  60 tablet  0  . Multiple Vitamin (MULTIVITAMIN WITH MINERALS) TABS Take 1 tablet by mouth daily.      . Multiple Vitamin (MULTIVITAMIN) tablet Take 1 tablet by mouth daily.      . Multiple Vitamins-Minerals (HAIR/SKIN/NAILS PO) Take 1 tablet by mouth daily.      . Olopatadine HCl (PATADAY) 0.2 % SOLN Apply 1 drop to eye as needed.      Marland Kitchen omeprazole (PRILOSEC) 20 MG capsule Take 1 capsule by mouth Daily.      . predniSONE (DELTASONE) 5 MG tablet Take 1 tablet by mouth Daily.      . Probiotic Product (PROBIOTIC DAILY PO) Take 1 tablet by mouth daily.      Marland Kitchen senna (SENOKOT) 8.6 MG TABS Take 2 tablets by mouth at bedtime.      . Wheat Dextrin (BENEFIBER PO) Take 10 mLs by mouth daily. Fiber supplement        ROS: A full review of systems is obtained and is negative except as noted in the HPI.  Physical Exam: Blood pressure 127/63, pulse 65, temperature 98.4 F (36.9 C), temperature source Oral, resp. rate 17, height 5' (1.524 m), weight 68.04 kg (150 lb), SpO2 98.00%.  GENERAL: no acute distress.  EYES: Extra ocular movements are intact. There is no lid lag. Sclera is anicteric.  ENT: Oropharynx is clear. Dentition is within normal limits.  NECK: Supple. The thyroid is not enlarged.  LYMPH: There are no masses or lymphadenopathy present.    HEART: Regular rate and rhythm with no m/g/r.  Normal S1/S2. minimal JVD LUNGS: basilar crackles  ABDOMEN: Soft, non-tender, and non-distended with  normoactive bowel sounds. There is no hepatosplenomegaly.  EXTREMITIES: No clubbing, cyanosis, or edema.  PULSES:  DP/PT pulses were +2 and equal bilaterally.  SKIN: Warm, dry, and intact.  NEUROLOGIC: The patient was oriented to person, place, and time. No overt neurologic deficits were detected.  PSYCH: Normal judgment and insight, mood is appropriate.   Results: Results for orders placed during the hospital encounter of 10/19/12 (from the past 24 hour(s))  GLUCOSE, CAPILLARY     Status: Normal   Collection Time   10/19/12  3:17 AM      Component Value Range   Glucose-Capillary 84  70 - 99 mg/dL  CBC     Status: Normal   Collection Time   10/19/12  3:24 AM      Component Value Range   WBC 6.0  4.0 - 10.5 K/uL   RBC 4.08  3.87 - 5.11 MIL/uL   Hemoglobin 12.7  12.0 - 15.0 g/dL   HCT 16.1  09.6 - 04.5 %   MCV 93.6  78.0 - 100.0 fL   MCH 31.1  26.0 - 34.0 pg   MCHC 33.2  30.0 - 36.0 g/dL   RDW 40.9  81.1 - 91.4 %   Platelets 154  150 - 400 K/uL  BASIC METABOLIC PANEL     Status: Abnormal   Collection Time   10/19/12  3:24 AM      Component Value Range   Sodium 141  135 - 145 mEq/L   Potassium 4.0  3.5 - 5.1 mEq/L   Chloride 104  96 - 112 mEq/L   CO2 29  19 - 32 mEq/L   Glucose, Bld 102 (*) 70 - 99 mg/dL   BUN 18  6 - 23 mg/dL   Creatinine, Ser 7.82  0.50 - 1.10 mg/dL   Calcium 8.9  8.4 - 95.6 mg/dL   GFR calc non Af Amer 60 (*) >90 mL/min   GFR calc Af Amer 69 (*) >90 mL/min  POCT I-STAT, CHEM 8     Status: Normal   Collection Time   10/19/12  4:12 AM      Component Value Range   Sodium 142  135 - 145 mEq/L   Potassium 3.8  3.5 - 5.1 mEq/L   Chloride 103  96 - 112 mEq/L   BUN 20  6 - 23 mg/dL   Creatinine, Ser 2.13  0.50 - 1.10 mg/dL   Glucose, Bld 96  70 - 99 mg/dL   Calcium, Ion 0.86  5.78 - 1.30 mmol/L   TCO2 30  0 -  100 mmol/L   Hemoglobin 12.9  12.0 - 15.0 g/dL   HCT 46.9  62.9 - 52.8 %    EKG: NSR with PAC's, no ST changes CXR: clear EKG strip from EMS: only leads I, II, III -> ST changes were due to V pacing and do not represent STEMI  Assessment/Plan: 79 yo WF with HTN and pacemaker here with SOB/CP 1. SOB/CP: typical pain but suspect was due to hypertensive episode and flash pulmonary edema rather than true acute coronary syndrome - cannot take ASA (rash) so she has been loaded with plavix - no heparin unless enzyme positive - rule out for AMI with serial biomarkers - given central venous pressure looks fairly normal now that BP is down, will avoid IV diuresis for now - check echo in AM 2. HTN: - continue imdur 30 at night and toprol xl 50 daily - might consider adding norvasc 2.5 depending  on how BP looks throughout the day 3. Sjogren's: - continue chronic steroid 4. DVT ppx: - lovenox 40mg  daily   Robie Oats 10/19/2012, 4:28 AM

## 2012-10-19 NOTE — ED Provider Notes (Signed)
History     CSN: [id number]  Arrival date & time 10/19/12  0249   First MD Initiated Contact with Patient 10/19/12 0300      Chief Complaint  Patient presents with  . Near Syncope    (Consider location/radiation/quality/duration/timing/severity/associated sxs/prior treatment) Patient is a 79 y.o. female presenting with chest pain. The history is provided by the patient.  Chest Pain The chest pain began 1 - 2 hours ago. Chest pain occurs constantly. The chest pain is improving. The severity of the pain is mild. The quality of the pain is described as pressure-like. The pain radiates to the left shoulder. Primary symptoms include shortness of breath. Pertinent negatives for primary symptoms include no fever, no cough, no nausea and no vomiting.  Pertinent negatives for associated symptoms include no claudication. She tried oxygen for the symptoms. Risk factors include being elderly.   near syncopal and per her daughter she had poor color.    No past medical history on file.  Past Surgical History  Procedure Date  . Appendectomy   . Knee arthroscopy   . Eye surgery   . Cholecystectomy   . Abdominal hysterectomy   . Tonsillectomy     Family History  Problem Relation Age of Onset  . Heart disease Mother     CHF  . Depression Mother   . Arthritis Mother   . Cancer Father   . Alcohol abuse Father   . Heart disease Father   . Hypertension Father   . Arthritis Father   . Cancer Sister   . Hypertension Sister   . Stroke Paternal Grandmother     History  Substance Use Topics  . Smoking status: Never Smoker   . Smokeless tobacco: Never Used  . Alcohol Use: Not on file    OB History    Grav Para Term Preterm Abortions TAB SAB Ect Mult Living                  Review of Systems  Constitutional: Negative for fever.  Respiratory: Positive for shortness of breath. Negative for cough.   Cardiovascular: Positive for chest pain. Negative for claudication.    Gastrointestinal: Negative for nausea and vomiting.  All other systems reviewed and are negative.    Allergies  Ace inhibitors; Amlodipine; Angiotensin receptor blockers; Avelox; Azor; Cephalexin; Ciprofloxacin hcl; Contrast media; Cortizone-10; Cymbalta; Depo-medrol; Flagyl; Penicillins; Zantac; Aspirin; Fentanyl citrate; Omnaris; Statins; and Wellbutrin  Home Medications   Current Outpatient Rx  Name  Route  Sig  Dispense  Refill  . ACETAMINOPHEN 500 MG PO TABS   Oral   Take 500 mg by mouth every 6 (six) hours as needed. But not with Norco         . CETIRIZINE HCL 10 MG PO CHEW   Oral   Chew 10 mg by mouth daily.         . CHLORPHENIRAMINE MALEATE 4 MG PO TABS   Oral   Take 4 mg by mouth as needed.         Marland Kitchen DICLOFENAC SODIUM 1 % TD GEL   Topical   Apply 1 application topically 4 (four) times daily. Apply to feet, hands, back, hip   3 Tube   4   . DOCUSATE SODIUM 100 MG PO CAPS   Oral   Take 100 mg by mouth 2 (two) times daily as needed.         . OMEGA-3 FATTY ACIDS 1000 MG PO CAPS   Oral  Take 1 g by mouth daily.         . FUROSEMIDE 20 MG PO TABS   Oral   Take 20 mg by mouth 2 (two) times daily as needed.         Marland Kitchen HYDROCODONE-ACETAMINOPHEN 7.5-325 MG PO TABS   Oral   Take 1 tablet by mouth every 6 (six) hours as needed.   120 tablet   0   . ISOSORBIDE MONONITRATE ER 30 MG PO TB24   Oral   Take 1 tablet by mouth Daily.         Marland Kitchen LEVOTHYROXINE SODIUM 25 MCG PO TABS   Oral   Take 25 mcg by mouth daily.         Marland Kitchen LIDOCAINE 5 % EX PTCH   Transdermal   Place 1 patch onto the skin daily. Remove & Discard patch within 12 hours or as directed by MD         . LORATADINE 10 MG PO TABS   Oral   Take 10 mg by mouth daily.         . MORPHINE SULFATE ER 15 MG PO TBCR   Oral   Take 1 tablet (15 mg total) by mouth 2 (two) times daily.   60 tablet   0   . ONE-DAILY MULTI VITAMINS PO TABS   Oral   Take 1 tablet by mouth daily.          . OLOPATADINE HCL 0.2 % OP SOLN   Ophthalmic   Apply 1 drop to eye as needed.         Marland Kitchen OMEPRAZOLE 20 MG PO CPDR   Oral   Take 1 capsule by mouth Daily.         Marland Kitchen PREDNISONE 5 MG PO TABS   Oral   Take 1 tablet by mouth Daily.         Marland Kitchen PREMARIN 0.625 MG/GM VA CREA   Vaginal   Place 1 application vaginally every three (3) days as needed. May use q 3-4 days prn         . PROBIOTIC FORMULA PO   Oral   Take 1 capsule by mouth daily.         . TOPROL XL 25 MG PO TB24   Oral   Take 2 tablets by mouth Daily.         . VENTOLIN HFA 108 (90 BASE) MCG/ACT IN AERS   Inhalation   Inhale 2 puffs into the lungs.           BP 139/60  Pulse 68  Temp 98.4 F (36.9 C) (Oral)  Resp 18  Ht 5' (1.524 m)  Wt 150 lb (68.04 kg)  BMI 29.30 kg/m2  SpO2 99%  Physical Exam  Constitutional: She appears well-developed and well-nourished. No distress.  HENT:  Head: Normocephalic and atraumatic.  Mouth/Throat: Oropharynx is clear and moist.  Eyes: Conjunctivae normal are normal. Pupils are equal, round, and reactive to light.  Neck: Normal range of motion. Neck supple.  Cardiovascular: Normal rate, regular rhythm and intact distal pulses.   Pulmonary/Chest: Effort normal and breath sounds normal. She has no wheezes. She has no rales.  Abdominal: Soft. Bowel sounds are normal. There is no tenderness. There is no rebound and no guarding.  Musculoskeletal: Normal range of motion.  Neurological: She is alert.  Skin: Skin is warm and dry.  Psychiatric: She has a normal mood and affect.    ED Course  Procedures (including critical care time)   Labs Reviewed  GLUCOSE, CAPILLARY  CBC  BASIC METABOLIC PANEL   No results found.   No diagnosis found.    MDM   Date: 10/19/2012  Rate: 67  Rhythm: normal sinus rhythm  QRS Axis: normal  Intervals: PR prolonged  ST/T Wave abnormalities: normal  Conduction Disutrbances:first-degree A-V block   Narrative  Interpretation:   Old EKG Reviewed: none available    Pain free following 2 SL NTG     Lorali Khamis K Aydee Mcnew-Rasch, MD 10/19/12 [phone]

## 2012-10-19 NOTE — ED Notes (Signed)
Patient ambulated to restroom and tolerated well.  

## 2012-10-19 NOTE — Progress Notes (Signed)
  Echocardiogram 2D Echocardiogram has been performed.  Kesler Wickham FRANCES 10/19/2012, 3:34 PM

## 2012-10-19 NOTE — Progress Notes (Signed)
TELEMETRY: Reviewed telemetry pt in NSR: Filed Vitals:   10/19/12 0338 10/19/12 0345 10/19/12 0500 10/19/12 0626  BP: 139/60 127/63 113/50 139/53  Pulse: 68 65 67 64  Temp:    97.4 F (36.3 C)  TempSrc:    Oral  Resp: 18 17 20    Height:    5' (1.524 m)  Weight:    150 lb (68.04 kg)  SpO2: 99% 98% 96% 98%   No intake or output data in the 24 hours ending 10/19/12 0806  SUBJECTIVE No further chest pain or SOB. Feels well.  LABS: Basic Metabolic Panel:  Basename 10/19/12 0412 10/19/12 0324  NA 142 141  K 3.8 4.0  CL 103 104  CO2 -- 29  GLUCOSE 96 102*  BUN 20 18  CREATININE 0.90 0.87  CALCIUM -- 8.9  MG -- --  PHOS -- --   CBC:  Basename 10/19/12 0412 10/19/12 0324  WBC -- 6.0  NEUTROABS -- --  HGB 12.9 12.7  HCT 38.0 38.2  MCV -- 93.6  PLT -- 154   Cardiac Enzymes:  Basename 10/19/12 0710  CKTOTAL --  CKMB --  CKMBINDEX --  TROPONINI <0.30   BNP: 659.7  Fasting Lipid Panel:  Basename 10/19/12 0710  CHOL 161  HDL 63  LDLCALC 82  TRIG 82  CHOLHDL 2.6  LDLDIRECT --    Radiology/Studies:  Dg Chest Portable 1 View  10/19/2012  *RADIOLOGY REPORT*  Clinical Data: Shortness of breath.  Near syncope.  PORTABLE CHEST - 1 VIEW  Comparison: 06/07/2012  Findings: Stable appearance of cardiac pacemaker.  Normal heart size and pulmonary vascularity.  The slight haziness over the left costophrenic angle is probably due to soft tissue attenuation.  No focal airspace consolidation.  No blunting of costophrenic angles. No pneumothorax.  Mediastinal contours appear intact.  Calcified and tortuous aorta.  Stable appearance since previous study.  IMPRESSION: No evidence of active pulmonary disease.   Original Report Authenticated By: Burman Nieves, M.D.     PHYSICAL EXAM General: Well developed, elderly, in no acute distress. Head: Normocephalic, atraumatic, sclera non-icteric, no xanthomas, nares are without discharge. Neck: Negative for carotid bruits. JVD not  elevated. Lungs: Clear bilaterally to auscultation without wheezes, rales, or rhonchi. Breathing is unlabored. Heart: RRR S1 S2 without murmurs, rubs, or gallops. Pacer in left subclavicular area.  Abdomen: Soft, non-tender, non-distended with normoactive bowel sounds. No hepatomegaly. No rebound/guarding. No obvious abdominal masses. Msk:  Strength and tone appears normal for age. Extremities: No clubbing, cyanosis or edema.  Distal pedal pulses are 2+ and equal bilaterally. Neuro: Alert and oriented X 3. Moves all extremities spontaneously. Psych:  Responds to questions appropriately with a normal affect.  ASSESSMENT AND PLAN: 1. Acute chest pain and shortness of breath. Probably related to acute hypertensive episode. BP normalized now. Patient under increased stress related to her husband being on home Hospice. Complained of increased back pain yesterday. Cardiac enzymes negative so far. Will cycle today. Check repeat Ecg and Echo today. Patient followed by Dr. Gloriajean Dell in [place]. Reports cardiac cath in 2010 which showed one blockage of 50%. If BP controlled today and no further symptoms will plan discharge tomorrow with follow up with Dr. Gloriajean Dell.  2. HTN controlled now on usual meds. If needed will add low dose amlodipine. History of multiple drug intolerances.  3. Sjogren's syndrome.  4. S/p PPM  Active Problems:  * No active hospital problems. *     Signed, Shakala Marlatt [place] MD,FACC 10/19/2012  8:13 AM

## 2012-10-19 NOTE — Progress Notes (Signed)
Utilization review completed.  

## 2012-10-20 ENCOUNTER — Encounter (HOSPITAL_COMMUNITY): Payer: Self-pay | Admitting: Physician Assistant

## 2012-10-20 DIAGNOSIS — I10 Essential (primary) hypertension: Secondary | ICD-10-CM

## 2012-10-20 DIAGNOSIS — R079 Chest pain, unspecified: Principal | ICD-10-CM

## 2012-10-20 DIAGNOSIS — M3500 Sicca syndrome, unspecified: Secondary | ICD-10-CM

## 2012-10-20 MED ORDER — PANTOPRAZOLE SODIUM 40 MG PO TBEC: 40.0000 mg | DELAYED_RELEASE_TABLET | Freq: Every day | ORAL | Status: DC

## 2012-10-20 MED ORDER — AMLODIPINE BESYLATE 2.5 MG PO TABS
2.5000 mg | ORAL_TABLET | Freq: Every day | ORAL | Status: DC
  Administered 2012-10-20: 2.5 mg via ORAL
  Filled 2012-10-20 (×2): qty 1

## 2012-10-20 MED ORDER — PANTOPRAZOLE SODIUM 40 MG PO TBEC: 40.0000 mg | DELAYED_RELEASE_TABLET | Freq: Every day | ORAL | Status: AC

## 2012-10-20 MED ORDER — CLOPIDOGREL BISULFATE 75 MG PO TABS: 75.0000 mg | ORAL_TABLET | Freq: Every day | ORAL | Status: DC

## 2012-10-20 MED ORDER — METOPROLOL SUCCINATE ER 50 MG PO TB24: 50.0000 mg | ORAL_TABLET | Freq: Every day | ORAL | Status: AC

## 2012-10-20 MED ORDER — ACETAMINOPHEN 500 MG PO TABS: 500.0000 mg | ORAL_TABLET | Freq: Four times a day (QID) | ORAL | Status: DC | PRN

## 2012-10-20 MED ORDER — NITROGLYCERIN 0.4 MG SL SUBL: 0.4000 mg | SUBLINGUAL_TABLET | SUBLINGUAL | Status: AC | PRN

## 2012-10-20 MED ORDER — AMLODIPINE BESYLATE 2.5 MG PO TABS: 2.5000 mg | ORAL_TABLET | Freq: Every day | ORAL | Status: DC

## 2012-10-20 MED ORDER — LORATADINE 10 MG PO TABS: 10.0000 mg | ORAL_TABLET | Freq: Every day | ORAL | Status: DC

## 2012-10-20 MED ORDER — CETIRIZINE HCL 10 MG PO CHEW: 10.0000 mg | CHEWABLE_TABLET | Freq: Every day | ORAL | Status: AC

## 2012-10-20 NOTE — Discharge Summary (Signed)
Discharge Summary   Patient ID: Tonya Hammond MRN: [id number], DOB/AGE: May 28, 1927 79 y.o. Admit date: 10/19/2012 D/C date:     10/20/2012  Primary Cardiologist: Tonya Hammond [facility])  Primary Discharge Diagnoses:  1. Chest felt/SOB felt secondary to acute hypertensive episode 2. HTN, accelerated  Secondary Discharge Diagnoses:  1. H/o pacemaker 2. Scoliosis 3. Hypothyroidism 4. GERD 5. Hiatal hernia 6. Arthritis 7. Sjogren's syndrome, on chronic steroid therapy  Hospital Course: Tonya Hammond is a 79 y.o.female with HTN and h/o pacemaker for ?SSS who presented to the ED yesterday for an episode of CP and SOB that woke her from sleep. She is followed by Dr. Leeann Hammond in [place] and reported cardiac cath in 2010 which showed one blockage of 50%. Her daughter said she had called out for her and was somewhat confused, diaphoretic and very short of breath. Her husband has a pulse ox and they put it on her to find her O2 sats in the mid 80s. She used her husbands oxygen with improvement in her symptoms. EMS was called and she was brought to the ED. Her pain was eventually completely relieved with 2 SL nitro. Her initial pressure was in the 220s/100s via EMS per the daughter. She has had no fever or chills. She has had no syncope but did feel dizzy during this episode. At time of cardiology evaluation in the ER, BP had normalized and neuro exam was normal. Her symptoms were felt due to hypertensive episode. CXR showed no acute pulmonary disease. She is intolerant to aspirin so was loaded with Plavix for antiplatelet coverage. She had reported being under increased stress related to her husband being on home hospice as well as increased back pain yesterday. Her cardiac enzymes remained negative. 2D Echo demonstrated EF 60-65%, no RWMA, grade 1 diastolic dsyfunction, mod TR, mild pulm HTN. Amlodipine was added - note she has prior allergy when on combination ARB/amlodipine but Tonya Hammond felt  this was probably related to ARB component and recommended to continue amlodipine until seen by Dr. Leeann Hammond. She feels well this morning without CP, SOB. Tonya Hammond has seen and examined the patient today and feels she is stable for discharge.    Discharge Vitals: Blood pressure 131/62, pulse 60, temperature 97.9 F (36.6 C), temperature source Oral, resp. rate 20, height 5' (1.524 m), weight 150 lb (68.04 kg), SpO2 98.00%.  Labs: Lab Results  Component Value Date   WBC 6.0 10/19/2012   HGB 12.9 10/19/2012   HCT 38.0 10/19/2012   MCV 93.6 10/19/2012   PLT 154 10/19/2012     Lab 10/19/12 0412 10/19/12 0324  NA 142 --  K 3.8 --  CL 103 --  CO2 -- 29  BUN 20 --  CREATININE 0.90 --  CALCIUM -- 8.9  PROT -- --  BILITOT -- --  ALKPHOS -- --  ALT -- --  AST -- --  GLUCOSE 96 --    Basename 10/19/12 1901 10/19/12 1252 10/19/12 0710  CKTOTAL -- -- --  CKMB -- -- --  TROPONINI <0.30 <0.30 <0.30   Lab Results  Component Value Date   CHOL 161 10/19/2012   HDL 63 10/19/2012   LDLCALC 82 10/19/2012   TRIG 82 10/19/2012    Diagnostic Studies/Procedures   Dg Chest Portable 1 View 10/19/2012  *RADIOLOGY REPORT*  Clinical Data: Shortness of breath.  Near syncope.  PORTABLE CHEST - 1 VIEW  Comparison: 06/07/2012  Findings: Stable appearance of cardiac pacemaker.  Normal heart size and pulmonary  vascularity.  The slight haziness over the left costophrenic angle is probably due to soft tissue attenuation.  No focal airspace consolidation.  No blunting of costophrenic angles. No pneumothorax.  Mediastinal contours appear intact.  Calcified and tortuous aorta.  Stable appearance since previous study.  IMPRESSION: No evidence of active pulmonary disease.   Original Report Authenticated By: Burman Nieves, M.D.    2D Echo Study Conclusions - Left ventricle: The cavity size was normal. Wall thickness was normal. Systolic function was normal. The estimated ejection fraction was in the range of 60% to  65%. Wall motion was normal; there were no regional wall motion abnormalities. Doppler parameters are consistent with abnormal left ventricular relaxation (grade 1 diastolic dysfunction). - Aortic valve: There was no stenosis. - Mitral valve: Mildly calcified annulus. Trivial regurgitation. - Right ventricle: The cavity size was mildly dilated. Systolic function was normal. - Tricuspid valve: Moderate regurgitation. Peak RV-RA gradient: 33mm Hg (S). - Pulmonary arteries: PA systolic pressure 39-43 mmHg. - Systemic veins: IVC measured 2.0 cm with normal respirophasic variation, suggesting RA pressure 6-10 mmHg. Impressions: - Normal LV size and systolic function, EF 60-65%. Normal RV size and systolic function. Moderate tricuspid regurgitation. Mild pulmonary hypertension.   Discharge Medications   Current Discharge Medication List    START taking these medications   Details  amLODipine (NORVASC) 2.5 MG tablet Take 1 tablet (2.5 mg total) by mouth daily. Qty: 30 tablet, Refills: 1   Comments: Further refills should come from your regular heart doctor.    clopidogrel (PLAVIX) 75 MG tablet Take 1 tablet (75 mg total) by mouth daily with breakfast. Qty: 30 tablet, Refills: 1   Comments: Further refills should come from your primary heart doctor.    metoprolol succinate (TOPROL-XL) 50 MG 24 hr tablet Take 1 tablet (50 mg total) by mouth daily. Qty: 30 tablet, Refills: 1   Comments: Further refills should come from your regular heart doctor.    nitroGLYCERIN (NITROSTAT) 0.4 MG SL tablet Place 1 tablet (0.4 mg total) under the tongue every 5 (five) minutes as needed for chest pain (up to 3 doses). Qty: 25 tablet, Refills: 1   Comments: Further refills should come from your regular heart doctor.    pantoprazole (PROTONIX) 40 MG tablet Take 1 tablet (40 mg total) by mouth daily. Qty: 30 tablet, Refills: 1   Comments: Take this medicine instead of omeprazole/Prilosec due to the  fact that you are on Plavix. Berneda Rose these medications which have CHANGED   Details  acetaminophen (TYLENOL) 500 MG tablet Take 1 tablet (500 mg total) by mouth every 6 (six) hours as needed for pain. But not with Norco    cetirizine (ZYRTEC) 10 MG chewable tablet Chew 1 tablet (10 mg total) by mouth daily.   Comments: You are already taking a type of medication in the same class, which is Claritin. Please contact your primary doctor to find out if they want you taking both Zyrtec and Claritin.    loratadine (CLARITIN) 10 MG tablet Take 1 tablet (10 mg total) by mouth daily.   Comments: You are already taking a type of medication in the same class, which is Zyrtec. Please contact your primary doctor to find out if they want you taking both Zyrtec and Claritin.      CONTINUE these medications which have NOT CHANGED   Details  albuterol (PROVENTIL HFA;VENTOLIN HFA) 108 (90 BASE) MCG/ACT inhaler Inhale 1-2 puffs into the lungs every  6 (six) hours as needed. Wheezing or shortness of breath    !! beta carotene w/minerals (OCUVITE) tablet Take 1 tablet by mouth daily.    Calcium Citrate-Vitamin D (CITRACAL PETITES/VITAMIN D) 200-250 MG-UNIT TABS Take 2 tablets by mouth daily.    chlorpheniramine (CHLOR-TRIMETON) 4 MG tablet Take 4 mg by mouth every 6 (six) hours as needed. For sinus congestion    docusate sodium (COLACE) 100 MG capsule Take 200 mg by mouth at bedtime.     estrogens, conjugated, (PREMARIN) 0.3 MG tablet Take 0.3 mg by mouth daily.    fish oil-omega-3 fatty acids 1000 MG capsule Take 1 g by mouth daily.    furosemide (LASIX) 20 MG tablet Take 20 mg by mouth 2 (two) times daily as needed.    HYDROcodone-acetaminophen (NORCO) 7.5-325 MG per tablet Take 1 tablet by mouth every 6 (six) hours as needed. Qty: 120 tablet, Refills: 0   Associated Diagnoses: Lumbar spondylosis; Myofascial pain; Osteoarthritis of both feet; Sjogren's syndrome; Trochanteric bursitis      isosorbide mononitrate (IMDUR) 30 MG 24 hr tablet Take 1 tablet by mouth at bedtime.     levothyroxine (SYNTHROID, LEVOTHROID) 25 MCG tablet Take 25 mcg by mouth daily.    lidocaine (LIDODERM) 5 % Place 1 patch onto the skin daily. Remove & Discard patch within 12 hours or as directed by MD    Menthol, Topical Analgesic, (BIOFREEZE) 4 % GEL Apply 1 application topically 4 (four) times daily as needed. Joint pain    morphine (MS CONTIN) 15 MG 12 hr tablet Take 1 tablet (15 mg total) by mouth 2 (two) times daily. Qty: 60 tablet, Refills: 0   Associated Diagnoses: Lumbar spondylosis; Myofascial pain; Osteoarthritis of both feet; Sjogren's syndrome; Trochanteric bursitis    !! Multiple Vitamin (MULTIVITAMIN WITH MINERALS) TABS Take 1 tablet by mouth daily.    !! Multiple Vitamins-Minerals (HAIR/SKIN/NAILS PO) Take 1 tablet by mouth daily.    Olopatadine HCl (PATADAY) 0.2 % SOLN Apply 1 drop to eye as needed.    predniSONE (DELTASONE) 5 MG tablet Take 1 tablet by mouth Daily.    Probiotic Product (PROBIOTIC DAILY PO) Take 1 tablet by mouth daily.    senna (SENOKOT) 8.6 MG TABS Take 2 tablets by mouth at bedtime.    Wheat Dextrin (BENEFIBER PO) Take 10 mLs by mouth daily. Fiber supplement     !! - Potential duplicate medications found. Please discuss with provider.    STOP taking these medications     omeprazole (PRILOSEC) 20 MG capsule Comments:  Reason for Stopping:          Disposition   The patient will be discharged in stable condition to home. Discharge Orders    Future Orders Please Complete By Expires   Diet - low sodium heart healthy      Increase activity slowly        Follow-up Information    Follow up with Elana Alm, MD. (Call Dr. Gevena Barre office today to schedule a follow up appointment to be seen within 1 week)    Contact information:   [address] [place] [address] [phone]       Follow up with Joycelyn Rua, MD. (Please call  your primary doctor to discuss if you should be taking both Zyrtec and Claritin (these medicines are in same class). You also need to discuss what vitamins you should/should not be taking to avoid taking too much of a particular supplement.)  Contact information:   [address] [place] [address] [phone]            Duration of Discharge Encounter: Greater than 30 minutes including physician and PA time.  Signed, Dayna Dunn PA-C 10/20/2012, 11:00 AM

## 2012-10-20 NOTE — Progress Notes (Addendum)
TELEMETRY: Reviewed telemetry pt in NSR, intermittent pacing: Filed Vitals:   10/19/12 1449 10/19/12 1607 10/19/12 2100 10/20/12 0500  BP: 166/78 152/70 168/78 131/62  Pulse: 66 61 73 60  Temp: 98.4 F (36.9 C)  97.5 F (36.4 C) 97.9 F (36.6 C)  TempSrc:   Oral Oral  Resp: 20  16 20   Height:      Weight:      SpO2: 97% 97% 97% 98%   No intake or output data in the 24 hours ending 10/20/12 0818  SUBJECTIVE No further chest pain or SOB. Feels well. Thinks that pain causes her BP to go up. Has chronic back pain and leg pain. BP better this am after Percocet.  LABS: Basic Metabolic Panel:  Basename 10/19/12 0412 10/19/12 0324  NA 142 141  K 3.8 4.0  CL 103 104  CO2 -- 29  GLUCOSE 96 102*  BUN 20 18  CREATININE 0.90 0.87  CALCIUM -- 8.9  MG -- --  PHOS -- --   CBC:  Basename 10/19/12 0412 10/19/12 0324  WBC -- 6.0  NEUTROABS -- --  HGB 12.9 12.7  HCT 38.0 38.2  MCV -- 93.6  PLT -- 154   Cardiac Enzymes:  Basename 10/19/12 1901 10/19/12 1252 10/19/12 0710  CKTOTAL -- -- --  CKMB -- -- --  CKMBINDEX -- -- --  TROPONINI <0.30 <0.30 <0.30   BNP: 659.7  Fasting Lipid Panel:  Basename 10/19/12 0710  CHOL 161  HDL 63  LDLCALC 82  TRIG 82  CHOLHDL 2.6  LDLDIRECT --    Radiology/Studies:  Dg Chest Portable 1 View  10/19/2012  *RADIOLOGY REPORT*  Clinical Data: Shortness of breath.  Near syncope.  PORTABLE CHEST - 1 VIEW  Comparison: 06/07/2012  Findings: Stable appearance of cardiac pacemaker.  Normal heart size and pulmonary vascularity.  The slight haziness over the left costophrenic angle is probably due to soft tissue attenuation.  No focal airspace consolidation.  No blunting of costophrenic angles. No pneumothorax.  Mediastinal contours appear intact.  Calcified and tortuous aorta.  Stable appearance since previous study.  IMPRESSION: No evidence of active pulmonary disease.   Original Report Authenticated By: Burman Nieves, M.D.    Transthoracic  Echocardiography  Patient: Tonya Hammond, Tonya Hammond MR #: [id number] Study Date: 10/19/2012 Gender: F Age: 78 Height: 152.4cm Weight: 68kg BSA: 1.9m^2 Pt. Status: Room: 3W06C  ORDERING [place], Peter REFERRING [place], Peter ATTENDING Rothbart, Valentina Shaggy, [facility] SONOGRAPHER [place], RCS ADMITTING [place] Score cc:  ------------------------------------------------------------ LV EF: 60% - 65%  ------------------------------------------------------------ Indications: Chest pain 786.51.  ------------------------------------------------------------ Study Conclusions  - Left ventricle: The cavity size was normal. Wall thickness was normal. Systolic function was normal. The estimated ejection fraction was in the range of 60% to 65%. Wall motion was normal; there were no regional wall motion abnormalities. Doppler parameters are consistent with abnormal left ventricular relaxation (grade 1 diastolic dysfunction). - Aortic valve: There was no stenosis. - Mitral valve: Mildly calcified annulus. Trivial regurgitation. - Right ventricle: The cavity size was mildly dilated. Systolic function was normal. - Tricuspid valve: Moderate regurgitation. Peak RV-RA gradient: 33mm Hg (S). - Pulmonary arteries: PA systolic pressure 39-43 mmHg. - Systemic veins: IVC measured 2.0 cm with normal respirophasic variation, suggesting RA pressure 6-10 mmHg. Impressions:  - Normal LV size and systolic function, EF 60-65%. Normal RV size and systolic function. Moderate tricuspid regurgitation. Mild pulmonary hypertension. Transthoracic echocardiography. M-mode, complete 2D, spectral Doppler, and color Doppler. Height: Height: 152.4cm. Height:  60in. Weight: Weight: 68kg. Weight: 149.6lb. Body mass index: BMI: 29.3kg/m^2. Body surface area: BSA: 1.53m^2. Blood pressure: 166/78. Patient status: Inpatient. Location:  Bedside.  ------------------------------------------------------------  ------------------------------------------------------------ Left ventricle: The cavity size was normal. Wall thickness was normal. Systolic function was normal. The estimated ejection fraction was in the range of 60% to 65%. Wall motion was normal; there were no regional wall motion abnormalities. Doppler parameters are consistent with abnormal left ventricular relaxation (grade 1 diastolic dysfunction).  ------------------------------------------------------------ Aortic valve: Trileaflet; mildly calcified leaflets. Doppler: There was no stenosis. No regurgitation.  ------------------------------------------------------------ Aorta: Aortic root: The aortic root was normal in size. Ascending aorta: The ascending aorta was normal in size.  ------------------------------------------------------------ Mitral valve: Mildly calcified annulus. Doppler: There was no evidence for stenosis. Trivial regurgitation.  ------------------------------------------------------------ Left atrium: The atrium was normal in size.  ------------------------------------------------------------ Right ventricle: The cavity size was mildly dilated. Systolic function was normal.  ------------------------------------------------------------ Pulmonic valve: Structurally normal valve. Cusp separation was normal. Doppler: Transvalvular velocity was within the normal range. No regurgitation.  ------------------------------------------------------------ Tricuspid valve: Doppler: Moderate regurgitation.  ------------------------------------------------------------ Pulmonary artery: PA systolic pressure 39-43 mmHg.  ------------------------------------------------------------ Right atrium: The atrium was normal in size.  ------------------------------------------------------------ Pericardium: There was no pericardial  effusion.  ------------------------------------------------------------ Systemic veins: IVC measured 2.0 cm with normal respirophasic variation, suggesting RA pressure 6-10 mmHg.  ------------------------------------------------------------  2D measurements Normal Doppler measurements Normal Left ventricle Left ventricle LVID ED, 34 mm 43-52 Ea, lat ann, 8.22 cm/s ------ chord, tiss DP PLAX Ea, med ann, 6.91 cm/s ------ LVID ES, 26.7 mm 23-38 tiss DP chord, Tricuspid valve PLAX Peak RV-RA 32 mm ------ FS, chord, 21 % >29 gradient, S Hg PLAX Right ventricle LVPW, ED 9.04 mm ------ Sa vel, lat 10.9 cm/s ------ IVS/LVPW 1.24 <1.3 ann, tiss DP ratio, ED Ventricular septum IVS, ED 11.2 mm ------ Aorta Root diam, 34 mm ------ ED Left atrium AP dim 30 mm ------ AP dim 1.74 cm/m^2 <2.2 index  ------------------------------------------------------------ Prepared and Electronically Authenticated by  Marca Ancona [id number]  PHYSICAL EXAM General: Well developed, elderly, in no acute distress. Head: Normocephalic, atraumatic, sclera non-icteric, no xanthomas, nares are without discharge. Neck: Negative for carotid bruits. JVD not elevated. Lungs: Clear bilaterally to auscultation without wheezes, rales, or rhonchi. Breathing is unlabored. Heart: RRR S1 S2 without murmurs, rubs, or gallops. Pacer in left subclavicular area.  Abdomen: Soft, non-tender, non-distended with normoactive bowel sounds. No hepatomegaly. No rebound/guarding. No obvious abdominal masses. Msk:  Strength and tone appears normal for age. Extremities: No clubbing, cyanosis or edema.  Distal pedal pulses are 2+ and equal bilaterally. Neuro: Alert and oriented X 3. Moves all extremities spontaneously. Psych:  Responds to questions appropriately with a normal affect.  ASSESSMENT AND PLAN: 1. Acute chest pain and shortness of breath. Probably related to acute hypertensive episode. BP normalized now  but still labile. Patient under increased stress related to her husband being on home Hospice. BP also exacerbated by chronic pain. Cardiac enzymes negative. Echo shows normal LV function. Patient followed by Dr. Gloriajean Dell in [place]. Reports cardiac cath in 2010 which showed one blockage of 50%.  Recommend discharge today. Will add amlodipine 2.5 mg daily until she can follow up with Dr. Gloriajean Dell. Continue plavix.  2. HTN  will add low dose amlodipine. History of multiple drug intolerances. Prior allergy when on combination of ARB/Amlodipine was probably related to ARB component.  3. Sjogren's syndrome.  4. S/p PPM  Active Problems:  Accelerated hypertension    Signed, Theron Arista  [place] MD,FACC 10/20/2012 8:18 AM

## 2012-10-20 NOTE — Discharge Summary (Signed)
Patient seen and examined and history reviewed. Agree with above findings and plan. See earlier rounding note.  Tonya Hammond [facility] 10/20/2012 11:48 AM

## 2013-10-04 DIAGNOSIS — I639 Cerebral infarction, unspecified: Secondary | ICD-10-CM

## 2013-10-04 DIAGNOSIS — I4891 Unspecified atrial fibrillation: Secondary | ICD-10-CM

## 2013-10-04 HISTORY — DX: Cerebral infarction, unspecified: I63.9

## 2013-10-04 HISTORY — DX: Unspecified atrial fibrillation: I48.91

## 2013-11-02 ENCOUNTER — Emergency Department (HOSPITAL_COMMUNITY): Payer: Medicare Other

## 2013-11-02 ENCOUNTER — Inpatient Hospital Stay (HOSPITAL_COMMUNITY)
Admission: EM | Admit: 2013-11-02 | Discharge: 2013-11-07 | DRG: 065 | Disposition: A | Discharge status: Home Health | Payer: Medicare Other | Attending: Internal Medicine | Admitting: Internal Medicine

## 2013-11-02 ENCOUNTER — Encounter (HOSPITAL_COMMUNITY): Payer: Self-pay | Admitting: Emergency Medicine

## 2013-11-02 DIAGNOSIS — R531 Weakness: Secondary | ICD-10-CM

## 2013-11-02 DIAGNOSIS — Z8249 Family history of ischemic heart disease and other diseases of the circulatory system: Secondary | ICD-10-CM

## 2013-11-02 DIAGNOSIS — D649 Anemia, unspecified: Secondary | ICD-10-CM | POA: Diagnosis not present

## 2013-11-02 DIAGNOSIS — I639 Cerebral infarction, unspecified: Secondary | ICD-10-CM | POA: Diagnosis present

## 2013-11-02 DIAGNOSIS — M19072 Primary osteoarthritis, left ankle and foot: Secondary | ICD-10-CM

## 2013-11-02 DIAGNOSIS — E039 Hypothyroidism, unspecified: Secondary | ICD-10-CM | POA: Diagnosis present

## 2013-11-02 DIAGNOSIS — M329 Systemic lupus erythematosus, unspecified: Secondary | ICD-10-CM | POA: Diagnosis present

## 2013-11-02 DIAGNOSIS — G8190 Hemiplegia, unspecified affecting unspecified side: Secondary | ICD-10-CM | POA: Diagnosis present

## 2013-11-02 DIAGNOSIS — K219 Gastro-esophageal reflux disease without esophagitis: Secondary | ICD-10-CM | POA: Diagnosis present

## 2013-11-02 DIAGNOSIS — R21 Rash and other nonspecific skin eruption: Secondary | ICD-10-CM | POA: Diagnosis present

## 2013-11-02 DIAGNOSIS — G459 Transient cerebral ischemic attack, unspecified: Secondary | ICD-10-CM | POA: Insufficient documentation

## 2013-11-02 DIAGNOSIS — Z515 Encounter for palliative care: Secondary | ICD-10-CM

## 2013-11-02 DIAGNOSIS — T7840XA Allergy, unspecified, initial encounter: Secondary | ICD-10-CM | POA: Diagnosis present

## 2013-11-02 DIAGNOSIS — Z8673 Personal history of transient ischemic attack (TIA), and cerebral infarction without residual deficits: Secondary | ICD-10-CM

## 2013-11-02 DIAGNOSIS — M3500 Sicca syndrome, unspecified: Secondary | ICD-10-CM | POA: Diagnosis present

## 2013-11-02 DIAGNOSIS — M7060 Trochanteric bursitis, unspecified hip: Secondary | ICD-10-CM

## 2013-11-02 DIAGNOSIS — Z66 Do not resuscitate: Secondary | ICD-10-CM | POA: Diagnosis not present

## 2013-11-02 DIAGNOSIS — I2510 Atherosclerotic heart disease of native coronary artery without angina pectoris: Secondary | ICD-10-CM | POA: Diagnosis present

## 2013-11-02 DIAGNOSIS — T4595XA Adverse effect of unspecified primarily systemic and hematological agent, initial encounter: Secondary | ICD-10-CM | POA: Diagnosis present

## 2013-11-02 DIAGNOSIS — M19071 Primary osteoarthritis, right ankle and foot: Secondary | ICD-10-CM

## 2013-11-02 DIAGNOSIS — I10 Essential (primary) hypertension: Secondary | ICD-10-CM | POA: Diagnosis present

## 2013-11-02 DIAGNOSIS — R29810 Facial weakness: Secondary | ICD-10-CM | POA: Diagnosis present

## 2013-11-02 DIAGNOSIS — I6350 Cerebral infarction due to unspecified occlusion or stenosis of unspecified cerebral artery: Principal | ICD-10-CM | POA: Diagnosis present

## 2013-11-02 DIAGNOSIS — M7918 Myalgia, other site: Secondary | ICD-10-CM

## 2013-11-02 DIAGNOSIS — IMO0002 Reserved for concepts with insufficient information to code with codable children: Secondary | ICD-10-CM | POA: Diagnosis present

## 2013-11-02 DIAGNOSIS — M47816 Spondylosis without myelopathy or radiculopathy, lumbar region: Secondary | ICD-10-CM

## 2013-11-02 DIAGNOSIS — Z79899 Other long term (current) drug therapy: Secondary | ICD-10-CM

## 2013-11-02 DIAGNOSIS — Z950 Presence of cardiac pacemaker: Secondary | ICD-10-CM | POA: Diagnosis present

## 2013-11-02 LAB — URINALYSIS, ROUTINE W REFLEX MICROSCOPIC
Bilirubin Urine: NEGATIVE
GLUCOSE, UA: NEGATIVE mg/dL
Hgb urine dipstick: NEGATIVE
Ketones, ur: NEGATIVE mg/dL
LEUKOCYTES UA: NEGATIVE
Nitrite: NEGATIVE
PH: 7 (ref 5.0–8.0)
PROTEIN: NEGATIVE mg/dL
Specific Gravity, Urine: 1.006 (ref 1.005–1.030)
Urobilinogen, UA: 0.2 mg/dL (ref 0.0–1.0)

## 2013-11-02 LAB — COMPREHENSIVE METABOLIC PANEL
ALBUMIN: 3.3 g/dL — AB (ref 3.5–5.2)
ALK PHOS: 48 U/L (ref 39–117)
ALT: 10 U/L (ref 0–35)
AST: 17 U/L (ref 0–37)
BUN: 14 mg/dL (ref 6–23)
CO2: 26 mEq/L (ref 19–32)
Calcium: 9.2 mg/dL (ref 8.4–10.5)
Chloride: 103 mEq/L (ref 96–112)
Creatinine, Ser: 0.78 mg/dL (ref 0.50–1.10)
GFR calc Af Amer: 87 mL/min — ABNORMAL LOW (ref >90)
GFR calc non Af Amer: 75 mL/min — ABNORMAL LOW (ref >90)
Glucose, Bld: 108 mg/dL — ABNORMAL HIGH (ref 70–99)
POTASSIUM: 4.1 meq/L (ref 3.7–5.3)
SODIUM: 141 meq/L (ref 137–147)
TOTAL PROTEIN: 6.1 g/dL (ref 6.0–8.3)
Total Bilirubin: 0.3 mg/dL (ref 0.3–1.2)

## 2013-11-02 LAB — POCT I-STAT TROPONIN I: TROPONIN I, POC: 0 ng/mL (ref 0.00–0.08)

## 2013-11-02 LAB — CBC
HCT: 37.3 % (ref 36.0–46.0)
Hemoglobin: 12.4 g/dL (ref 12.0–15.0)
MCH: 30.7 pg (ref 26.0–34.0)
MCHC: 33.2 g/dL (ref 30.0–36.0)
MCV: 92.3 fL (ref 78.0–100.0)
Platelets: 160 10*3/uL (ref 150–400)
RBC: 4.04 MIL/uL (ref 3.87–5.11)
RDW: 12.6 % (ref 11.5–15.5)
WBC: 5.2 10*3/uL (ref 4.0–10.5)

## 2013-11-02 LAB — APTT: aPTT: 23 seconds — ABNORMAL LOW (ref 24–37)

## 2013-11-02 LAB — PROTIME-INR
INR: 1.05 (ref 0.00–1.49)
PROTHROMBIN TIME: 13.5 s (ref 11.6–15.2)

## 2013-11-02 LAB — GLUCOSE, CAPILLARY: Glucose-Capillary: 128 mg/dL — ABNORMAL HIGH (ref 70–99)

## 2013-11-02 MED ORDER — PREDNISONE 20 MG PO TABS
40.0000 mg | ORAL_TABLET | Freq: Every day | ORAL | Status: AC
  Administered 2013-11-03 – 2013-11-07 (×5): 40 mg via ORAL
  Filled 2013-11-02 (×7): qty 2

## 2013-11-02 MED ORDER — ONDANSETRON HCL 4 MG/2ML IJ SOLN: 4.0000 mg | Freq: Three times a day (TID) | INTRAMUSCULAR | Status: AC | PRN

## 2013-11-02 MED ORDER — METOPROLOL SUCCINATE ER 50 MG PO TB24
50.0000 mg | ORAL_TABLET | Freq: Every day | ORAL | Status: DC
  Administered 2013-11-04 – 2013-11-05 (×2): 50 mg via ORAL
  Filled 2013-11-02 (×4): qty 1

## 2013-11-02 MED ORDER — NON FORMULARY: Status: DC | PRN

## 2013-11-02 MED ORDER — MORPHINE SULFATE 10 MG/5ML PO SOLN
10.0000 mg | Freq: Three times a day (TID) | ORAL | Status: DC | PRN
  Administered 2013-11-04 – 2013-11-07 (×5): 10 mg via ORAL
  Filled 2013-11-02 (×5): qty 5

## 2013-11-02 MED ORDER — FUROSEMIDE 20 MG PO TABS
20.0000 mg | ORAL_TABLET | Freq: Every day | ORAL | Status: DC
  Administered 2013-11-03 – 2013-11-05 (×3): 20 mg via ORAL
  Filled 2013-11-02 (×6): qty 1

## 2013-11-02 MED ORDER — PREDNISONE 20 MG PO TABS
60.0000 mg | ORAL_TABLET | Freq: Once | ORAL | Status: AC
  Administered 2013-11-02: 60 mg via ORAL
  Filled 2013-11-02: qty 3

## 2013-11-02 MED ORDER — ASPIRIN-DIPYRIDAMOLE ER 25-200 MG PO CP12
1.0000 | ORAL_CAPSULE | Freq: Two times a day (BID) | ORAL | Status: DC
  Filled 2013-11-02 (×6): qty 1

## 2013-11-02 MED ORDER — DIPHENHYDRAMINE HCL 50 MG/ML IJ SOLN
25.0000 mg | Freq: Once | INTRAMUSCULAR | Status: AC
  Administered 2013-11-02: 25 mg via INTRAVENOUS
  Filled 2013-11-02: qty 1

## 2013-11-02 MED ORDER — SALINE SPRAY 0.65 % NA SOLN
1.0000 | NASAL | Status: DC | PRN
  Administered 2013-11-03: 1 via NASAL
  Filled 2013-11-02: qty 44

## 2013-11-02 MED ORDER — POLYETHYL GLYCOL-PROPYL GLYCOL 0.4-0.3 % OP SOLN
1.0000 [drp] | OPHTHALMIC | Status: DC | PRN
  Filled 2013-11-02: qty 1

## 2013-11-02 MED ORDER — ISOSORBIDE MONONITRATE ER 30 MG PO TB24
30.0000 mg | ORAL_TABLET | Freq: Every day | ORAL | Status: DC
  Administered 2013-11-03 – 2013-11-06 (×5): 30 mg via ORAL
  Filled 2013-11-02 (×8): qty 1

## 2013-11-02 MED ORDER — DOCUSATE SODIUM 100 MG PO CAPS
200.0000 mg | ORAL_CAPSULE | Freq: Every day | ORAL | Status: DC
  Administered 2013-11-04 – 2013-11-06 (×3): 200 mg via ORAL
  Filled 2013-11-02 (×7): qty 2

## 2013-11-02 MED ORDER — ENOXAPARIN SODIUM 40 MG/0.4ML ~~LOC~~ SOLN
40.0000 mg | Freq: Every day | SUBCUTANEOUS | Status: DC
  Administered 2013-11-03 (×2): 40 mg via SUBCUTANEOUS
  Filled 2013-11-02 (×5): qty 0.4

## 2013-11-02 MED ORDER — FAMOTIDINE IN NACL 20-0.9 MG/50ML-% IV SOLN: 20.0000 mg | Freq: Once | INTRAVENOUS | Status: DC

## 2013-11-02 MED ORDER — LEVOTHYROXINE SODIUM 25 MCG PO TABS
25.0000 ug | ORAL_TABLET | Freq: Every day | ORAL | Status: DC
  Administered 2013-11-03 – 2013-11-07 (×5): 25 ug via ORAL
  Filled 2013-11-02 (×8): qty 1

## 2013-11-02 MED ORDER — MORPHINE SULFATE ER 15 MG PO TBCR
15.0000 mg | EXTENDED_RELEASE_TABLET | Freq: Two times a day (BID) | ORAL | Status: DC
  Administered 2013-11-03 – 2013-11-07 (×10): 15 mg via ORAL
  Filled 2013-11-02 (×10): qty 1

## 2013-11-02 MED ORDER — PANTOPRAZOLE SODIUM 40 MG PO TBEC
40.0000 mg | DELAYED_RELEASE_TABLET | Freq: Every day | ORAL | Status: DC
  Administered 2013-11-03 – 2013-11-07 (×5): 40 mg via ORAL
  Filled 2013-11-02 (×6): qty 1

## 2013-11-02 MED ORDER — PREDNISONE 5 MG PO TABS: 5.0000 mg | ORAL_TABLET | Freq: Every day | ORAL | Status: DC

## 2013-11-02 MED ORDER — DIAZEPAM 2 MG PO TABS
2.0000 mg | ORAL_TABLET | Freq: Every evening | ORAL | Status: DC | PRN
  Administered 2013-11-03: 2 mg via ORAL
  Filled 2013-11-02: qty 1

## 2013-11-02 MED ORDER — NITROGLYCERIN 0.4 MG SL SUBL: 0.4000 mg | SUBLINGUAL_TABLET | SUBLINGUAL | Status: DC | PRN

## 2013-11-02 MED ORDER — DIPHENHYDRAMINE HCL 25 MG PO CAPS
50.0000 mg | ORAL_CAPSULE | Freq: Three times a day (TID) | ORAL | Status: DC | PRN
  Administered 2013-11-04 – 2013-11-05 (×4): 50 mg via ORAL
  Filled 2013-11-02 (×5): qty 2

## 2013-11-02 NOTE — ED Provider Notes (Signed)
CSN: [id number]     Arrival date & time 11/02/13  1540 History   First MD Initiated Contact with Patient 11/02/13 1545     Chief Complaint  Patient presents with  . Allergic Reaction   (Consider location/radiation/quality/duration/timing/severity/associated sxs/prior Treatment) Patient is a 80 y.o. female presenting with allergic reaction. The history is provided by the patient and medical records. No language interpreter was used.  Allergic Reaction Presenting symptoms: rash   Presenting symptoms: no wheezing     Tonya Hammond is a 80 y.o. female  with a hx of pacemaker placed 2/2 syncope and dysrhythmia, arthritis, anemia, hypothyroidism, HTN, Sjogren's, Lupus, CVA (late December 2014) presents to the Emergency Department complaining of gradual, persistent, progressively worsening rash onset this AM upon waking.  She reports no change in soaps, detergent, lotion, make-up and foods.  Pt reports addition of Plavix to her medication regimen 2 weeks ago without difficulty until today.  Pt reports she ate breakfast then took a nap.  When she awoke the rash had spread throughout her body and she was itching.  She took 50mg  benadryl PO at 1:30pm without relief.    She also reports sudden onset change in vision in the left eye with associated eyelid droop beginning around 4PM.  She reports associated epigastric discomfort beginning at the same time.  Nothing seems to make her symptoms better or worse.  She denies fever, chills, headache, neck pain, shortness of breath, abdominal pain, nausea, vomiting, weakness, dizziness, syncope.  Past Medical History  Diagnosis Date  . Pacemaker 2010    syncope  . Scoliosis   . Hypothyroidism   . HTN (hypertension)   . Sjogren's disease   . Complication of anesthesia     difficulty waking "  . Dysrhythmia     2n degree heart block  . GERD (gastroesophageal reflux disease)   . H/O hiatal hernia   . Arthritis   . Anemia   . S/P cardiac cath     a.  Reported cardiac cath in 2010 which showed one blockage of 50%.   Past Surgical History  Procedure Laterality Date  . Appendectomy    . Knee arthroscopy    . Eye surgery    . Cholecystectomy    . Abdominal hysterectomy    . Tonsillectomy     Family History  Problem Relation Age of Onset  . Heart disease Mother [id number]    CHF  . Depression Mother   . Arthritis Mother   . Cancer Father   . Alcohol abuse Father   . Heart disease Father   . Hypertension Father   . Arthritis Father   . Cancer Sister   . Hypertension Sister   . Stroke Paternal Grandmother    History  Substance Use Topics  . Smoking status: Never Smoker   . Smokeless tobacco: Never Used  . Alcohol Use: No   OB History   Grav Para Term Preterm Abortions TAB SAB Ect Mult Living                 Review of Systems  Constitutional: Negative for fever, diaphoresis, appetite change, fatigue and unexpected weight change.  HENT: Negative for mouth sores.   Eyes: Positive for visual disturbance (left eye).  Respiratory: Negative for cough, chest tightness, shortness of breath and wheezing.   Cardiovascular: Positive for chest pain (epigastric/chest discomfort).  Gastrointestinal: Negative for nausea, vomiting, abdominal pain, diarrhea and constipation.  Endocrine: Negative for polydipsia, polyphagia and polyuria.  Genitourinary: Positive  for frequency. Negative for dysuria, urgency and hematuria.  Musculoskeletal: Positive for back pain (low back). Negative for neck stiffness.  Skin: Positive for rash.       Itching  Allergic/Immunologic: Negative for immunocompromised state.  Neurological: Positive for facial asymmetry (left eye only). Negative for syncope, light-headedness and headaches.  Hematological: Does not bruise/bleed easily.  Psychiatric/Behavioral: Negative for sleep disturbance. The patient is not nervous/anxious.     Allergies  Ace inhibitors; Amlodipine; Angiotensin receptor blockers; Avelox; Azor;  Cephalexin; Ciprofloxacin hcl; Contrast media; Cortizone-10; Cymbalta; Depo-medrol; Flagyl; Penicillins; Plavix; Sulfa antibiotics; Zantac; Aspirin; Fentanyl citrate; Omnaris; Statins; and Wellbutrin  Home Medications   No current outpatient prescriptions on file. BP 129/47  Pulse 61  Temp(Src) 97.2 F (36.2 C) (Oral)  Resp 20  Ht 5' (1.524 m)  Wt 133 lb 12.8 oz (60.691 kg)  BMI 26.13 kg/m2  SpO2 97% Physical Exam  Nursing note and vitals reviewed. Constitutional: She is oriented to person, place, and time. She appears well-developed and well-nourished. No distress.  HENT:  Head: Normocephalic and atraumatic.  Mouth/Throat: Oropharynx is clear and moist.  No swelling of the tongue or oropharynx  Eyes: Conjunctivae and EOM are normal. Pupils are equal, round, and reactive to light. No scleral icterus.  Neck: Normal range of motion. Neck supple.  No stridor  Cardiovascular: Normal rate, regular rhythm, normal heart sounds and intact distal pulses.   No murmur heard. Pulmonary/Chest: Effort normal and breath sounds normal. No respiratory distress. She has no wheezes. She has no rales.  Clear and equal breath sounds  Abdominal: Soft. Bowel sounds are normal. There is no tenderness. There is no rebound and no guarding.  Soft and nontender  Musculoskeletal: Normal range of motion.  Full range of motion of all major joints  Lymphadenopathy:    She has no cervical adenopathy.  Neurological: She is alert and oriented to person, place, and time. She has normal reflexes. No cranial nerve deficit. She exhibits normal muscle tone. Coordination normal.  Speech is clear and goal oriented, follows commands Cranial nerves III - XII without deficit, minimal left eye droop, no further facial droop Normal strength in upper and lower extremities bilaterally, strong and equal grip strength Sensation normal to light and sharp touch Moves extremities without ataxia, coordination intact Normal finger  to nose and rapid alternating movements Neg modified romberg, no pronator drift Normal heel-shin   Skin: Skin is warm and dry. No rash noted. She is not diaphoretic.  Erythematous macular rash noted to the face, chest, back, upper and lower extremities with some excoriation  Psychiatric: She has a normal mood and affect. Her behavior is normal. Judgment and thought content normal.    ED Course  Procedures (including critical care time) Labs Review Labs Reviewed  COMPREHENSIVE METABOLIC PANEL - Abnormal; Notable for the following:    Glucose, Bld 108 (*)    Albumin 3.3 (*)    GFR calc non Af Amer 75 (*)    GFR calc Af Amer 87 (*)    All other components within normal limits  APTT - Abnormal; Notable for the following:    aPTT 23 (*)    All other components within normal limits  GLUCOSE, CAPILLARY - Abnormal; Notable for the following:    Glucose-Capillary 128 (*)    All other components within normal limits  MRSA PCR SCREENING  CBC  PROTIME-INR  URINALYSIS, ROUTINE W REFLEX MICROSCOPIC  HEMOGLOBIN A1C  LIPID PANEL  TSH  POCT I-STAT TROPONIN I  Imaging Review Dg Chest 2 View  11/02/2013   CLINICAL DATA:  Chest pain and fever.  EXAM: CHEST  2 VIEW  COMPARISON:  10/19/2012  FINDINGS: There is a left chest wall pacer device with lead in the right atrial appendage and right ventricle. The heart size appears normal. Small bilateral pleural effusions are identified. Pulmonary vascular congestion without over edema is noted. The visualized osseous structures are remarkable for scoliosis and multilevel spondylosis.  IMPRESSION: 1. Small bilateral pleural effusions and pulmonary vascular congestion.   Electronically Signed   By: Signa Kell M.D.   On: 11/02/2013 17:20   Ct Head Wo Contrast  11/02/2013   CLINICAL DATA:  Loss of vision in the left eye.  EXAM: CT HEAD WITHOUT CONTRAST  TECHNIQUE: Contiguous axial images were obtained from the base of the skull through the vertex without  intravenous contrast.  COMPARISON:  None.  FINDINGS: Ventricles are normal in configuration. There is ventricular and sulcal enlargement reflecting mild, age related atrophy. No hydrocephalus.  There are no parenchymal masses or mass effect. Patchy periventricular white matter hypoattenuation is noted consistent with mild chronic microvascular ischemic change. There is no evidence of a cortical infarct.  No extra-axial masses or abnormal fluid collections.  There is no intracranial hemorrhage.  Globes and orbits are unremarkable.  Visualized sinuses and mastoid air cells are clear.  IMPRESSION: 1. No acute intracranial abnormalities. 2. No significant abnormality of either globe or orbit. 3. Mild atrophy and mild chronic microvascular ischemic change.   Electronically Signed   By: Amie Portland M.D.   On: 11/02/2013 16:44      MDM   1. TIA (transient ischemic attack)   2. Sjogren's syndrome   3. Allergic reaction   4. Rash   5. HTN (hypertension)   6. Lupus      Tonya Hammond presents with several different complaints. Patient initially with allergic reaction but then stating left eye vision changes with associated left eye droop confirmed by daughter.  Patient also endorsed in epigastric and chest discomfort without shortness of breath or nausea.  Patient has a pacemaker and a history of CVA.  And ACS, CVA workup. She will be unable to have an MRI secondary to her pacemaker.  Patient also endorsed in urinary frequency, we'll assess for urinary tract infection.  Will give prednisone PO for allergic reaction and repeat Benadryl as needed.  Anticipate admission for further work-up.  4:30 PM Discussed with Devoria Albe, MD who agrees with the current plan.   Discussed the patient with  7:05 PM Pt with resolution of eye droop and rash.  She reports no further itching and continues to deny SOB or feeling of throat swelling.  Pt's chest discomfort is atypical and fleeting.    Record review shows  that pt was admitted at a [facility] facility on 09/30/13 and Dx with TIA.   "She was admitted for presumed acutee transient brain ischemic event. Vertigo continued but became less intense. She remained in paced rhythm. She was on apixaban at the time of admission. Cardiac enzymes Were unremarkable. She was seen in consultation by cardiology (Dr. Leeann Must - Her cardiologist) and underwent cardiolite stress test 12/30. There was no evidence of ischemia or reversible defect. She was seen in consultation by neurology and it was felt after extensive review that she possibly had an acute "ministroke". Unfortunately MRI could not be accomplished due to pacemaker."  In light of these records we will consult Neurology for recommendation of  disposition and anticoagulant.    8:00PM Neurology recommends admission for echocardiogram and carotid Doppler studies it does not appear that she got carotid Dopplers at the [facility] facility.  He recommends that she continue her Plavix and monitor the allergic reaction.  Discussed with the hospitalist who will admit here at [facility] neurology will consult.  The patient was discussed with and seen by Dr. Devoria Albe who agrees with the treatment plan.   Dahlia Client Paxon Propes, PA-C 11/03/13 0221

## 2013-11-02 NOTE — ED Provider Notes (Addendum)
Patient reports she was recently started on Plavix and protonic she reports this morning she woke up with a pruritic rash. She states her cheeks were red and she took a nap and when she woke up an hour later she had a itching rash around her neck and scattered on her arms. She has had benadryl and the rash is better. She states she was on Plavix about a year ago and had to stop it, states she felt better after she stopped it, however she does not recall how it made her feel bad. She also reports having chest discomfort only lasts a few seconds. She states she feels weak today. She states she had an episode where she had difficulty speaking on the phone this morning. She denies any difficulty walking to the bathroom or using her arms to feed herself or dress herself. She states she has chronic trouble swallowing and it is not worse now. She told us she had a recent stroke and was admitted somewhere else about 2 weeks ago. That was when she was started on the Plavix.  Patient is a pleasant elderly female who is alert and cooperative. She has no facial asymmetry, she has no focal motor weakness. She has a few small red areas on her skin however they do not look urticarial and that they are not raised. She does not have any excoriations from scratching.  Medical screening examination/treatment/procedure(s) were conducted as a shared visit with non-physician practitioner(s) and myself.  I personally evaluated the patient during the encounter.  EKG Interpretation    Date/Time:  Friday November 02 2013 16:46:12 EST Ventricular Rate:  71 PR Interval:  250 QRS Duration: 67 QT Interval:  383 QTC Calculation: 416 R Axis:   180 Text Interpretation:  Sinus rhythm Prolonged PR interval Anterolateral infarct, age indeterminate Baseline wander in lead(s) V2 No significant change since last tracing 19 Oct 2012 Confirmed by [facility]  MD-I, Benard Minturn ([id number]) on 11/02/2013 5:41:52 PM             Devoria AlbeIva Leeon Makar, MD,  FACEP   Ward GivensIva L Glendon Fiser, MD 11/02/13 [id number]  Ward GivensIva L Catrinia Racicot, MD 11/02/13 1924  Ward GivensIva L Radwan Cowley, MD 11/03/13 1500

## 2013-11-02 NOTE — ED Notes (Signed)
Upon initial examination pt states she feels her tongue is swelling more and she states she is having a difficult time breathing,  O2 sats are 99 % on room air,  Pt appears to be anxious,  Daughter at bedside interjects when I ask the pt a question.  Pt is alert and oriented in NAD

## 2013-11-02 NOTE — H&P (Signed)
[facility] History and Physical  Tonya Hammond WUJ:[id number] DOB: 04-08-1927 DOA: 11/02/2013  Referring physician:  PCP: Joycelyn Rua, MD   Chief Complaint: Rash/facial droop  HPI: Tonya Hammond is a 80 y.o. female with a past medical history of pacemaker implant, sick sinus syndrome, hypertension, Lupus, presenting in this evening to the emergency department as a transfer from an assisted-living facility. she reports noticing a rash over her face this morning which she initially attributed to butterfly rash from lupus. Over the course of the morning her rash spread down to upper part of her trunk and to bilateral extremities. She was administered Benadryl by nursing home staff at approximately 1:30 PM. She states that she was started on Plavix therapy approximately 2 weeks ago. While in the emergency room she was noted to have left-sided facial droop as well as right lower extremity weakness which gradually resolved. During my encounter she reports resolution to facial droop although continues to have right lower extremity weakness. Patient was recently admitted to [facility] in December 2014 at which time she was treated for transient ischemic attack. She recently underwent Cardiolite stress test in December as well which did not show evidence for ischemia or old defect. Because of history of pacemaker implant, MRI cannot be done.                                                                                                                                                                        Review of Systems:  Constitutional:  No weight loss, night sweats, Fevers, chills, fatigue.  HEENT:  No headaches, Difficulty swallowing,Tooth/dental problems,Sore throat,  No sneezing, itching, ear ache, nasal congestion, post nasal drip,  Cardio-vascular:  No chest pain, Orthopnea, PND, swelling in lower extremities, anasarca, dizziness, palpitations  GI:  No heartburn,  indigestion, abdominal pain, nausea, vomiting, diarrhea, change in bowel habits, loss of appetite  Resp:  No shortness of breath with exertion or at rest. No excess mucus, no productive cough, No non-productive cough, No coughing up of blood.No change in color of mucus.No wheezing.No chest wall deformity  Skin:  Positive for pruritic rash involving face, upper trunk, and bilateral legs. GU:  no dysuria, change in color of urine, no urgency or frequency. No flank pain.  Musculoskeletal:  Positive for joint pain. No decreased range of motion. No back pain.  Psych:  No change in mood or affect. No depression or anxiety. No memory loss.   Past Medical History  Diagnosis Date  . Pacemaker 2010    syncope  . Scoliosis   . Hypothyroidism   . HTN (hypertension)   . Sjogren's disease   . Complication of anesthesia     difficulty waking "  . Dysrhythmia  2n degree heart block  . GERD (gastroesophageal reflux disease)   . H/O hiatal hernia   . Arthritis   . Anemia   . S/P cardiac cath     a. Reported cardiac cath in 2010 which showed one blockage of 50%.   Past Surgical History  Procedure Laterality Date  . Appendectomy    . Knee arthroscopy    . Eye surgery    . Cholecystectomy    . Abdominal hysterectomy    . Tonsillectomy     Social History:  reports that she has never smoked. She has never used smokeless tobacco. She reports that she does not drink alcohol or use illicit drugs.  Allergies  Allergen Reactions  . Ace Inhibitors   . Amlodipine Other (See Comments)    Tired and feels bad  . Angiotensin Receptor Blockers   . Avelox [Moxifloxacin Hcl In Nacl]   . Azor [Amlodipine-Olmesartan]   . Cephalexin   . Ciprofloxacin Hcl   . Contrast Media [Iodinated Diagnostic Agents] Anaphylaxis  . Cortizone-10 [Hydrocortisone]     Can take prednisone  . Cymbalta [Duloxetine Hcl]   . Depo-Medrol [Methylprednisolone Acetate]     Can take prednisone  . Flagyl [Metronidazole]    . Penicillins   . Plavix [Clopidogrel Bisulfate] Shortness Of Breath    Rash per pt and daughter  . Sulfa Antibiotics Rash  . Zantac [Ranitidine Hcl]   . Aspirin Rash  . Fentanyl Citrate   . Omnaris [Ciclesonide]   . Statins   . Wellbutrin [Bupropion] Hives    Family History  Problem Relation Age of Onset  . Heart disease Mother 45    CHF  . Depression Mother   . Arthritis Mother   . Cancer Father   . Alcohol abuse Father   . Heart disease Father   . Hypertension Father   . Arthritis Father   . Cancer Sister   . Hypertension Sister   . Stroke Paternal Grandmother      Prior to Admission medications   Medication Sig Start Date End Date Taking? Authorizing Provider  acetaminophen (TYLENOL) 500 MG tablet Take 1 tablet (500 mg total) by mouth every 6 (six) hours as needed for pain. But not with Norco 10/20/12  Yes Dayna N Dunn, PA-C  albuterol (PROVENTIL HFA;VENTOLIN HFA) 108 (90 BASE) MCG/ACT inhaler Inhale 1-2 puffs into the lungs every 6 (six) hours as needed. Wheezing or shortness of breath   Yes Historical Provider, MD  Calcium Citrate-Vitamin D (CITRACAL PETITES/VITAMIN D) 200-250 MG-UNIT TABS Take 2 tablets by mouth daily.   Yes Historical Provider, MD  cetirizine (ZYRTEC) 10 MG chewable tablet Chew 1 tablet (10 mg total) by mouth daily. 10/20/12  Yes Dayna N Dunn, PA-C  clopidogrel (PLAVIX) 75 MG tablet Take 1 tablet (75 mg total) by mouth daily with breakfast. 10/20/12  Yes Dayna N Dunn, PA-C  diazepam (VALIUM) 2 MG tablet Take 2 mg by mouth at bedtime as needed for anxiety.   Yes Historical Provider, MD  docusate sodium (COLACE) 100 MG capsule Take 200 mg by mouth at bedtime.    Yes Historical Provider, MD  estrogens, conjugated, (PREMARIN) 0.3 MG tablet Take 0.3 mg by mouth daily.   Yes Historical Provider, MD  furosemide (LASIX) 20 MG tablet Take 20 mg by mouth daily.    Yes Historical Provider, MD  isosorbide mononitrate (IMDUR) 30 MG 24 hr tablet Take 1 tablet by mouth  at bedtime.  12/24/11  Yes Joycelyn Rua, MD  levothyroxine (SYNTHROID, LEVOTHROID) 25 MCG tablet Take 25 mcg by mouth daily.   Yes Joycelyn Rua, MD  lidocaine (LIDODERM) 5 % Place 1 patch onto the skin daily. Remove & Discard patch within 12 hours or as directed by MD   Yes Historical Provider, MD  loratadine (CLARITIN) 10 MG tablet Take 1 tablet (10 mg total) by mouth daily. 10/20/12  Yes Dayna N Dunn, PA-C  meclizine (ANTIVERT) 12.5 MG tablet Take 12.5 mg by mouth 2 (two) times daily.   Yes Historical Provider, MD  Menthol, Topical Analgesic, (BIOFREEZE) 4 % GEL Apply 1 application topically 4 (four) times daily as needed. Joint pain   Yes Historical Provider, MD  metoprolol succinate (TOPROL-XL) 50 MG 24 hr tablet Take 1 tablet (50 mg total) by mouth daily. 10/20/12  Yes Dayna N Dunn, PA-C  morphine (MS CONTIN) 15 MG 12 hr tablet Take 1 tablet (15 mg total) by mouth 2 (two) times daily. 04/28/12  Yes Ranelle Oyster, MD  morphine 10 MG/5ML solution Take 10 mg by mouth every 8 (eight) hours as needed for moderate pain or severe pain.   Yes Historical Provider, MD  Multiple Vitamin (MULTIVITAMIN WITH MINERALS) TABS Take 1 tablet by mouth daily.   Yes Historical Provider, MD  Multiple Vitamins-Minerals (HAIR/SKIN/NAILS PO) Take 1 tablet by mouth daily.   Yes Historical Provider, MD  NIFEdipine (PROCARDIA) 10 MG capsule Take 10 mg by mouth daily.   Yes Historical Provider, MD  nitroGLYCERIN (NITROSTAT) 0.4 MG SL tablet Place 1 tablet (0.4 mg total) under the tongue every 5 (five) minutes as needed for chest pain (up to 3 doses). 10/20/12  Yes Dayna N Dunn, PA-C  Olopatadine HCl (PATADAY) 0.2 % SOLN Apply 1 drop to eye as needed.   Yes Historical Provider, MD  pantoprazole (PROTONIX) 40 MG tablet Take 1 tablet (40 mg total) by mouth daily. 10/20/12  Yes Dayna N Dunn, PA-C  predniSONE (DELTASONE) 5 MG tablet Take 5 mg by mouth daily.   Yes Historical Provider, MD  Probiotic Product (PROBIOTIC DAILY PO)  Take 1 tablet by mouth daily.   Yes Historical Provider, MD  senna (SENOKOT) 8.6 MG TABS Take 2 tablets by mouth at bedtime.   Yes Historical Provider, MD   Physical Exam: Filed Vitals:   11/02/13 1941  BP: 152/72  Pulse: 66  Temp: 98.1 F (36.7 C)  Resp: 20    BP 152/72  Pulse 66  Temp(Src) 98.1 F (36.7 C) (Oral)  Resp 20  SpO2 96%  General:  Appears calm and comfortable, no acute distress Eyes: PERRL, normal lids, irises & conjunctiva ENT: grossly normal hearing, lips & tongue Neck: no LAD, masses or thyromegaly Cardiovascular: RRR, no m/r/g. No LE edema. Telemetry: SR, no arrhythmias  Respiratory: CTA bilaterally, no w/r/r. Normal respiratory effort. Abdomen: soft, ntnd Skin: no rash or induration seen on limited exam Musculoskeletal: grossly normal tone BUE/BLE Psychiatric: grossly normal mood and affect, speech fluent and appropriate Neurologic: I do not no facial droop, cranial nerves II through XII are grossly intact, slurred speech is not evident. No tongue deviation. She has 5 of 5 muscle 2 bilateral upper extremities and 5 out of 5 musculature left lower extremity, 3-5 musculature right lower extremity. There is no alteration to sensation. Diminished deep tendon reflexes bilaterally.           Labs on Admission:  Basic Metabolic Panel:  Recent Labs Lab 11/02/13 1700  NA 141  K 4.1  CL 103  CO2 26  GLUCOSE 108*  BUN 14  CREATININE 0.78  CALCIUM 9.2   Liver Function Tests:  Recent Labs Lab 11/02/13 1700  AST 17  ALT 10  ALKPHOS 48  BILITOT 0.3  PROT 6.1  ALBUMIN 3.3*   No results found for this basename: LIPASE, AMYLASE,  in the last 168 hours No results found for this basename: AMMONIA,  in the last 168 hours CBC:  Recent Labs Lab 11/02/13 1700  WBC 5.2  HGB 12.4  HCT 37.3  MCV 92.3  PLT 160   Cardiac Enzymes: No results found for this basename: CKTOTAL, CKMB, CKMBINDEX, TROPONINI,  in the last 168 hours  BNP (last 3 results) No  results found for this basename: PROBNP,  in the last 8760 hours CBG: No results found for this basename: GLUCAP,  in the last 168 hours  Radiological Exams on Admission: Dg Chest 2 View  11/02/2013   CLINICAL DATA:  Chest pain and fever.  EXAM: CHEST  2 VIEW  COMPARISON:  10/19/2012  FINDINGS: There is a left chest wall pacer device with lead in the right atrial appendage and right ventricle. The heart size appears normal. Small bilateral pleural effusions are identified. Pulmonary vascular congestion without over edema is noted. The visualized osseous structures are remarkable for scoliosis and multilevel spondylosis.  IMPRESSION: 1. Small bilateral pleural effusions and pulmonary vascular congestion.   Electronically Signed   By: Signa Kellaylor  Stroud M.D.   On: 11/02/2013 17:20   Ct Head Wo Contrast  11/02/2013   CLINICAL DATA:  Loss of vision in the left eye.  EXAM: CT HEAD WITHOUT CONTRAST  TECHNIQUE: Contiguous axial images were obtained from the base of the skull through the vertex without intravenous contrast.  COMPARISON:  None.  FINDINGS: Ventricles are normal in configuration. There is ventricular and sulcal enlargement reflecting mild, age related atrophy. No hydrocephalus.  There are no parenchymal masses or mass effect. Patchy periventricular white matter hypoattenuation is noted consistent with mild chronic microvascular ischemic change. There is no evidence of a cortical infarct.  No extra-axial masses or abnormal fluid collections.  There is no intracranial hemorrhage.  Globes and orbits are unremarkable.  Visualized sinuses and mastoid air cells are clear.  IMPRESSION: 1. No acute intracranial abnormalities. 2. No significant abnormality of either globe or orbit. 3. Mild atrophy and mild chronic microvascular ischemic change.   Electronically Signed   By: Amie Portlandavid  Ormond M.D.   On: 11/02/2013 16:44    EKG: Independently reviewed.   Assessment/Plan Active Problems:   TIA (transient ischemic  attack)   Allergic reaction   HTN (hypertension)   Pacemaker   Lupus   1. Suspected transient ischemic attack. Patient developing facial droop and leg weakness while in the emergency department that was resolving by the time I evaluated her. She states being started on Plavix 2 weeks ago then developed pruritic rash today. It is unclear to me if this represents drug reaction to Plavix. Given that she has been on Plavix for the last 2 weeks, however, I will change to Aggrenox. Place patient on TIA protocol. Unfortunately with history of pacemaker implant MRI cannot be performed. 2. Possible drug reaction. Patient developing pruritic rash this morning involving face upper trunk and bilateral extremities, improving to Benadryl. She states starting Plavix therapy approximately 2 weeks ago. As I mentioned above we'll Plavix for now administer as needed Benadryl. She does not have evidence of respiratory compromise during my encounter. 3. Hypertension. Blood pressures stable,  will continue patient's home regimen with metoprolol and Imdur.  4. History of lupus. Continue steroid therapy 5. History of pacemaker implant. Per medical records having history of sick sinus syndrome.  6. Coronary artery disease. She presently denies chest pain or shortness of breath. She recently underwent Wentz noninvasive study which did not show evidence of ischemia or visual defect. 7. Hypothyroidism. Check a TSH level, continue Synthroid 25 mcg by mouth daily 8. DVT prophylaxis. Lovenox   Code Status: Full code Family Communication: I spoke with patient's daughter is present at bedside Disposition Plan: Will admit patient to telemetry, to say she may require greater than 2 night hospitalization  Time spent: 65 minutes  Jeralyn Bennett [facility] Pager [phone]

## 2013-11-02 NOTE — ED Notes (Signed)
Per EMS-states pt started plavix on the 6th-states she now how a rash-last time she took med was yesterday-no respiratory distress-EMS stated they did not see rash

## 2013-11-02 NOTE — ED Notes (Signed)
Bed: WA01 Expected date:  Expected time:  Means of arrival:  Comments: EMS-allergic reaction to plavix?

## 2013-11-03 DIAGNOSIS — I369 Nonrheumatic tricuspid valve disorder, unspecified: Secondary | ICD-10-CM

## 2013-11-03 DIAGNOSIS — R21 Rash and other nonspecific skin eruption: Secondary | ICD-10-CM | POA: Diagnosis present

## 2013-11-03 DIAGNOSIS — I639 Cerebral infarction, unspecified: Secondary | ICD-10-CM | POA: Diagnosis present

## 2013-11-03 LAB — GLUCOSE, CAPILLARY
GLUCOSE-CAPILLARY: 102 mg/dL — AB (ref 70–99)
GLUCOSE-CAPILLARY: 144 mg/dL — AB (ref 70–99)
Glucose-Capillary: 96 mg/dL (ref 70–99)

## 2013-11-03 LAB — LIPID PANEL
CHOL/HDL RATIO: 3.4 ratio
Cholesterol: 138 mg/dL (ref 0–200)
HDL: 41 mg/dL (ref >39)
LDL CALC: 84 mg/dL (ref 0–99)
TRIGLYCERIDES: 66 mg/dL (ref <150)
VLDL: 13 mg/dL (ref 0–40)

## 2013-11-03 LAB — MRSA PCR SCREENING: MRSA BY PCR: POSITIVE — AB

## 2013-11-03 LAB — TSH: TSH: 0.709 u[IU]/mL (ref 0.350–4.500)

## 2013-11-03 LAB — HEMOGLOBIN A1C
HEMOGLOBIN A1C: 5.6 % (ref <5.7)
Mean Plasma Glucose: 114 mg/dL (ref <117)

## 2013-11-03 MED ORDER — POLYVINYL ALCOHOL 1.4 % OP SOLN
1.0000 [drp] | OPHTHALMIC | Status: DC | PRN
  Administered 2013-11-03 – 2013-11-04 (×2): 1 [drp] via OPHTHALMIC
  Filled 2013-11-03: qty 15

## 2013-11-03 NOTE — Consult Note (Addendum)
NEURO HOSPITALIST CONSULT NOTE    Reason for Consult: right leg weakness  HPI:                                                                                                                                          Tonya Hammond is an 80 y.o. female, right handed, with a past medical history significant for HTN, sick sinus syndrome, syncope, s/p pacemaker placement, hypothyroidism, Lupus, scoliosis, transferred to [facility] for further evaluation of probable stroke. Tonya Hammond is a resident at an assisted living facility and was initially evaluated yesterday afternoon at [facility] ED after noticing a rash in her face that was though to be secondary to an allergic reaction to plavix. She said that while in the ED she experienced " a painful sensation in my right arm that was rapidly followed by pain-numbness-weakness of my right leg, mainly the right foot and I couldn't raise it".Stated that she was surprised by the severity of the weakness when she was examined by the ED physician yesterday afternoon. Of note, there is conflicting report about whether or not she had a left face droop that resolved in the ED. She said that she woke up yesterday morning feeling " miserable", and recalls that " right after breakfast, perhaps around 830 am, I was confused and was having difficulty talking, things were coming out of my mouth differently". Denies associated HA, vertigo, double vision, chest pain, or palpitations. CT brain at [facility] showed no acute abnormality. TTE unremarkable. Total cholesterol 138, triglycerides 66, HDL 41, LDL 84 Transferred to [facility] due to concern for an acute stroke. LAST KNOWN WELL: UNCERTAIN, PERHAPS 638 am 11/02/13 TPA not given due to unclear time of onset, late presentation.    Past Medical History  Diagnosis Date  . Pacemaker 2010    syncope  . Scoliosis   . Hypothyroidism   . HTN (hypertension)   . Sjogren's disease   . Complication of anesthesia      difficulty waking "  . Dysrhythmia     2n degree heart block  . GERD (gastroesophageal reflux disease)   . H/O hiatal hernia   . Arthritis   . Anemia   . S/P cardiac cath     a. Reported cardiac cath in 2010 which showed one blockage of 50%.    Past Surgical History  Procedure Laterality Date  . Appendectomy    . Knee arthroscopy    . Eye surgery    . Cholecystectomy    . Abdominal hysterectomy    . Tonsillectomy      Family History  Problem Relation Age of Onset  . Heart disease Mother 35    CHF  . Depression Mother   . Arthritis Mother   . Cancer Father   .  Alcohol abuse Father   . Heart disease Father   . Hypertension Father   . Arthritis Father   . Cancer Sister   . Hypertension Sister   . Stroke Paternal Grandmother     Social History:  reports that she has never smoked. She has never used smokeless tobacco. She reports that she does not drink alcohol or use illicit drugs.  Allergies  Allergen Reactions  . Ace Inhibitors   . Amlodipine Other (See Comments)    Tired and feels bad  . Angiotensin Receptor Blockers   . Avelox [Moxifloxacin Hcl In Nacl]   . Azor [Amlodipine-Olmesartan]   . Cephalexin   . Ciprofloxacin Hcl   . Contrast Media [Iodinated Diagnostic Agents] Anaphylaxis  . Cortizone-10 [Hydrocortisone]     Can take prednisone  . Cymbalta [Duloxetine Hcl]   . Depo-Medrol [Methylprednisolone Acetate]     Can take prednisone  . Flagyl [Metronidazole]   . Penicillins   . Plavix [Clopidogrel Bisulfate] Shortness Of Breath    Rash per pt and daughter  . Sulfa Antibiotics Rash  . Zantac [Ranitidine Hcl]   . Aspirin Rash  . Fentanyl Citrate   . Omnaris [Ciclesonide]   . Statins   . Wellbutrin [Bupropion] Hives    MEDICATIONS:                                                                                                                     I have reviewed the patient's current medications.   ROS:                                                                                                                                        History obtained from the patient and chart review.  General ROS: negative for - chills, fever, night sweats, weight gain or weight loss Psychological ROS: negative for - behavioral disorder, hallucinations, memory difficulties, mood swings or suicidal ideation Ophthalmic ROS: negative for - blurry vision, double vision, eye pain or loss of vision ENT ROS: negative for - epistaxis, nasal discharge, oral lesions, sore throat, tinnitus. Significant for episodic vertigo. Allergy and Immunology ROS: negative for - hives or itchy/watery eyes Hematological and Lymphatic ROS: negative for - bleeding problems, bruising or swollen lymph nodes Endocrine ROS: negative for - galactorrhea, hair pattern changes, polydipsia/polyuria or temperature intolerance Respiratory ROS: negative for - cough, hemoptysis, shortness of breath or wheezing Cardiovascular ROS:  negative for - chest pain, dyspnea on exertion, edema or irregular heartbeat Gastrointestinal ROS: negative for - abdominal pain, diarrhea, hematemesis, nausea/vomiting or stool incontinence Genito-Urinary ROS: negative for - dysuria, hematuria, incontinence or urinary frequency/urgency Musculoskeletal ROS: negative for - joint swelling Neurological ROS: as noted in HPI Dermatological ROS: significant for facial rash   Physical exam: pleasant female in no apparent distress. Blood pressure 130/58, pulse 75, temperature 98.6 F (37 C), temperature source Oral, resp. rate 18, height 5' (1.524 m), weight 60.691 kg (133 lb 12.8 oz), SpO2 98.00%. Head: normocephalic. Neck: supple, no bruits, no JVD. Cardiac: no murmurs. Lungs: clear. Abdomen: soft, no tender, no mass. Extremities: no edema. CV: pulses palpable throughout  Neurologic Examination:                                                                                                      Mental  Status: Alert, oriented, thought content appropriate.  Speech fluent without evidence of aphasia.  Able to follow 3 step commands without difficulty. Cranial Nerves: II: Discs flat bilaterally; Visual fields grossly normal, pupils equal, round, reactive to light and accommodation III,IV, VI: ptosis not present, extra-ocular motions intact bilaterally V,VII: smile symmetric, facial light touch sensation normal bilaterally VIII: hearing normal bilaterally IX,X: gag reflex present XI: bilateral shoulder shrug XII: midline tongue extension without atrophy or fasciculations  Motor: Significant for right leg weakness. Tone and bulk:normal tone throughout Sensory: Pinprick and light touch intact throughout, bilaterally Deep Tendon Reflexes:  Right: Upper Extremity   Left: Upper extremity   biceps (C-5 to C-6) 2/4   biceps (C-5 to C-6) 2/4 tricep (C7) 2/4    triceps (C7) 2/4 Brachioradialis (C6) 2/4  Brachioradialis (C6) 2/4  Lower Extremity Lower Extremity  quadriceps (L-2 to L-4) 2/4   quadriceps (L-2 to L-4) 2/4 Achilles (S1) 2/4   Achilles (S1) 2/4  Plantars: Right: downgoing   Left: downgoing Cerebellar: normal finger-to-nose,  normal heel-to-shin test Gait:  No tested.    Lab Results  Component Value Date/Time   CHOL 138 11/03/2013  5:50 AM    Results for orders placed during the hospital encounter of 11/02/13 (from the past 48 hour(s))  URINALYSIS, ROUTINE W REFLEX MICROSCOPIC     Status: None   Collection Time    11/02/13  4:32 PM      Result Value Range   Color, Urine YELLOW  YELLOW   APPearance CLEAR  CLEAR   Specific Gravity, Urine 1.006  1.005 - 1.030   pH 7.0  5.0 - 8.0   Glucose, UA NEGATIVE  NEGATIVE mg/dL   Hgb urine dipstick NEGATIVE  NEGATIVE   Bilirubin Urine NEGATIVE  NEGATIVE   Ketones, ur NEGATIVE  NEGATIVE mg/dL   Protein, ur NEGATIVE  NEGATIVE mg/dL   Urobilinogen, UA 0.2  0.0 - 1.0 mg/dL   Nitrite NEGATIVE  NEGATIVE   Leukocytes, UA NEGATIVE   NEGATIVE   Comment: MICROSCOPIC NOT DONE ON URINES WITH NEGATIVE PROTEIN, BLOOD, LEUKOCYTES, NITRITE, OR GLUCOSE <1000 mg/dL.  CBC     Status: None   Collection  Time    11/02/13  5:00 PM      Result Value Range   WBC 5.2  4.0 - 10.5 K/uL   RBC 4.04  3.87 - 5.11 MIL/uL   Hemoglobin 12.4  12.0 - 15.0 g/dL   HCT 37.3  36.0 - 46.0 %   MCV 92.3  78.0 - 100.0 fL   MCH 30.7  26.0 - 34.0 pg   MCHC 33.2  30.0 - 36.0 g/dL   RDW 12.6  11.5 - 15.5 %   Platelets 160  150 - 400 K/uL  COMPREHENSIVE METABOLIC PANEL     Status: Abnormal   Collection Time    11/02/13  5:00 PM      Result Value Range   Sodium 141  137 - 147 mEq/L   Potassium 4.1  3.7 - 5.3 mEq/L   Chloride 103  96 - 112 mEq/L   CO2 26  19 - 32 mEq/L   Glucose, Bld 108 (*) 70 - 99 mg/dL   BUN 14  6 - 23 mg/dL   Creatinine, Ser 0.78  0.50 - 1.10 mg/dL   Calcium 9.2  8.4 - 10.5 mg/dL   Total Protein 6.1  6.0 - 8.3 g/dL   Albumin 3.3 (*) 3.5 - 5.2 g/dL   AST 17  0 - 37 U/L   ALT 10  0 - 35 U/L   Alkaline Phosphatase 48  39 - 117 U/L   Total Bilirubin 0.3  0.3 - 1.2 mg/dL   GFR calc non Af Amer 75 (*) >90 mL/min   GFR calc Af Amer 87 (*) >90 mL/min   Comment: (NOTE)     The eGFR has been calculated using the CKD EPI equation.     This calculation has not been validated in all clinical situations.     eGFR's persistently <90 mL/min signify possible Chronic Kidney     Disease.  PROTIME-INR     Status: None   Collection Time    11/02/13  5:00 PM      Result Value Range   Prothrombin Time 13.5  11.6 - 15.2 seconds   INR 1.05  0.00 - 1.49  APTT     Status: Abnormal   Collection Time    11/02/13  5:00 PM      Result Value Range   aPTT 23 (*) 24 - 37 seconds  POCT I-STAT TROPONIN I     Status: None   Collection Time    11/02/13  5:11 PM      Result Value Range   Troponin i, poc 0.00  0.00 - 0.08 ng/mL   Comment 3            Comment: Due to the release kinetics of cTnI,     a negative result within the first hours     of  the onset of symptoms does not rule out     myocardial infarction with certainty.     If myocardial infarction is still suspected,     repeat the test at appropriate intervals.  GLUCOSE, CAPILLARY     Status: Abnormal   Collection Time    11/02/13 10:58 PM      Result Value Range   Glucose-Capillary 128 (*) 70 - 99 mg/dL  MRSA PCR SCREENING     Status: Abnormal   Collection Time    11/03/13  3:01 AM      Result Value Range   MRSA by PCR POSITIVE (*) NEGATIVE  Comment:            The GeneXpert MRSA Assay (FDA     approved for NASAL specimens     only), is one component of a     comprehensive MRSA colonization     surveillance program. It is not     intended to diagnose MRSA     infection nor to guide or     monitor treatment for     MRSA infections.     RESULT CALLED TO, READ BACK BY AND VERIFIED WITH:     C SIMON AT 0719 ON 01.31.2015 BY NBROOKS  HEMOGLOBIN A1C     Status: None   Collection Time    11/03/13  5:50 AM      Result Value Range   Hemoglobin A1C 5.6  <5.7 %   Comment: (NOTE)                                                                               According to the ADA Clinical Practice Recommendations for 2011, when     HbA1c is used as a screening test:      >=6.5%   Diagnostic of Diabetes Mellitus               (if abnormal result is confirmed)     5.7-6.4%   Increased risk of developing Diabetes Mellitus     References:Diagnosis and Classification of Diabetes Mellitus,Diabetes     WUXL,2440,10(UVOZD 1):S62-S69 and Standards of Medical Care in             Diabetes - 2011,Diabetes Care,2011,34 (Suppl 1):S11-S61.   Mean Plasma Glucose 114  <117 mg/dL   Comment: Performed at [place]     Status: None   Collection Time    11/03/13  5:50 AM      Result Value Range   Cholesterol 138  0 - 200 mg/dL   Triglycerides 66  <150 mg/dL   HDL 41  >39 mg/dL   Total CHOL/HDL Ratio 3.4     VLDL 13  0 - 40 mg/dL   LDL Cholesterol 84  0 - 99  mg/dL   Comment:            Total Cholesterol/HDL:CHD Risk     Coronary Heart Disease Risk Table                         Men   Women      1/2 Average Risk   3.4   3.3      Average Risk       5.0   4.4      2 X Average Risk   9.6   7.1      3 X Average Risk  23.4   11.0                Use the calculated Patient Ratio     above and the CHD Risk Table     to determine the patient's CHD Risk.                ATP III CLASSIFICATION (LDL):      <  100     mg/dL   Optimal      100-129  mg/dL   Near or Above                        Optimal      130-159  mg/dL   Borderline      160-189  mg/dL   High      >190     mg/dL   Very High     Performed at [facility]  TSH     Status: None   Collection Time    11/03/13  5:50 AM      Result Value Range   TSH 0.709  0.350 - 4.500 uIU/mL   Comment: Performed at [place], CAPILLARY     Status: None   Collection Time    11/03/13  7:38 AM      Result Value Range   Glucose-Capillary 96  70 - 99 mg/dL  GLUCOSE, CAPILLARY     Status: Abnormal   Collection Time    11/03/13 11:29 AM      Result Value Range   Glucose-Capillary 102 (*) 70 - 99 mg/dL  GLUCOSE, CAPILLARY     Status: Abnormal   Collection Time    11/03/13  4:45 PM      Result Value Range   Glucose-Capillary 144 (*) 70 - 99 mg/dL    Dg Chest 2 View  11/02/2013   CLINICAL DATA:  Chest pain and fever.  EXAM: CHEST  2 VIEW  COMPARISON:  10/19/2012  FINDINGS: There is a left chest wall pacer device with lead in the right atrial appendage and right ventricle. The heart size appears normal. Small bilateral pleural effusions are identified. Pulmonary vascular congestion without over edema is noted. The visualized osseous structures are remarkable for scoliosis and multilevel spondylosis.  IMPRESSION: 1. Small bilateral pleural effusions and pulmonary vascular congestion.   Electronically Signed   By: Kerby Moors M.D.   On: 11/02/2013 17:20   Ct Head Wo  Contrast  11/02/2013   CLINICAL DATA:  Loss of vision in the left eye.  EXAM: CT HEAD WITHOUT CONTRAST  TECHNIQUE: Contiguous axial images were obtained from the base of the skull through the vertex without intravenous contrast.  COMPARISON:  None.  FINDINGS: Ventricles are normal in configuration. There is ventricular and sulcal enlargement reflecting mild, age related atrophy. No hydrocephalus.  There are no parenchymal masses or mass effect. Patchy periventricular white matter hypoattenuation is noted consistent with mild chronic microvascular ischemic change. There is no evidence of a cortical infarct.  No extra-axial masses or abnormal fluid collections.  There is no intracranial hemorrhage.  Globes and orbits are unremarkable.  Visualized sinuses and mastoid air cells are clear.  IMPRESSION: 1. No acute intracranial abnormalities. 2. No significant abnormality of either globe or orbit. 3. Mild atrophy and mild chronic microvascular ischemic change.   Electronically Signed   By: Lajean Manes M.D.   On: 11/02/2013 16:44   Assessment/Plan: 80 y/o with new onset right LE weakness. She described transient language difficulty and confusion yesterday morning and apparently had ? left face droopiness in the ED. Last seen normal unclear, perhaps 830 am yesterday and she presented to [facility] ED yesterday afternoon. TPA not administered due to unclear time of onset and also late presentation. Probable left brain infarct. Can not have MRI due to pacemaker.. Plan: 1) CT brain in am. Complete stroke  work up. 2) Aspirin 81 mg daily (concern of allergy to plavix) 3) PT Will follow up.   Dorian Pod, MD 11/03/2013, 5:24 PM [facility]

## 2013-11-03 NOTE — Progress Notes (Signed)
Echocardiogram 2D Echocardiogram has been performed.  Tonya GrumblesMyers, Tonya Hammond 11/03/2013, 11:49 AM

## 2013-11-03 NOTE — Progress Notes (Signed)
PROGRESS NOTE  Tonya Hammond WUJ:[id number] DOB: 11-08-1926 DOA: 11/02/2013 PCP: Joycelyn Rua, MD  HPI: Tonya Hammond is a 80 y.o. female with a past medical history of pacemaker implant, sick sinus syndrome, hypertension, Lupus, presenting in this evening to the emergency department as a transfer from an assisted-living facility. she reports noticing a rash over her face this morning which she initially attributed to butterfly rash from lupus. Over the course of the morning her rash spread down to upper part of her trunk and to bilateral extremities. She was administered Benadryl by nursing home staff at approximately 1:30 PM. She states that she was started on Plavix therapy approximately 2 weeks ago. While in the emergency room she was noted to have left-sided facial droop as well as right lower extremity weakness which gradually resolved. During my encounter she reports resolution to facial droop although continues to have right lower extremity weakness. Patient was recently admitted to [facility] in December 2014 at which time she was treated for transient ischemic attack. She recently underwent Cardiolite stress test in December as well which did not show evidence for ischemia or old defect. Because of history of pacemaker implant, MRI cannot be done.   Assessment/Plan:  CVA - patient with left sided facial droop and right upper and lower extremity weakness in the ED. Symptoms gradually improved but persistent right LE weakness this morning on my assessment. Discussed with Neurology, this is likely CVA will transfer to [facility] for Stroke team consult.  - Unfortunately with history of pacemaker implant MRI cannot be performed.  - 2D echo, doppler carotid ordered.  Rash - Possible drug reaction to Plavix. Rash improved with benadryl and with discontinuation of Plavix.  Hypertension. Blood pressures stable, will continue patient's home regimen with metoprolol and Imdur.  History  of lupus. Continue steroid therapy History of pacemaker implant. Per medical records having history of sick sinus syndrome.  Sick sinus sd - s/p Pacemaker Coronary artery disease. She presently denies chest pain or shortness of breath.  Hypothyroidism. Check a TSH level, continue Synthroid 25 mcg by mouth daily   Diet: heart healthy Fluids: none DVT Prophylaxis: Lovenox  Code Status: Full Family Communication: none  Disposition Plan: [facility] transfer  Consultants:  Neurology  Procedures:  none   Antibiotics - none  HPI/Subjective: - persistent right leg weakness this morning.  Objective: Filed Vitals:   11/02/13 1542 11/02/13 1941 11/02/13 2230 11/03/13 0610  BP: 112/66 152/72 129/47 111/50  Pulse: 66 66 61 62  Temp: 97.5 F (36.4 C) 98.1 F (36.7 C) 97.2 F (36.2 C) 97.3 F (36.3 C)  TempSrc: Oral Oral Oral Oral  Resp: 16 20 20 20   Height:   5' (1.524 m)   Weight:   60.691 kg (133 lb 12.8 oz)   SpO2: 95% 96% 97% 99%    Intake/Output Summary (Last 24 hours) at 11/03/13 0804 Last data filed at 11/03/13 0104  Gross per 24 hour  Intake      0 ml  Output    200 ml  Net   -200 ml   Filed Weights   11/02/13 2230  Weight: 60.691 kg (133 lb 12.8 oz)    Exam:   General:  NAD  Cardiovascular: regular rate and rhythm, without MRG  Respiratory: good air movement, clear to auscultation throughout, no wheezing, ronchi or rales  Abdomen: soft, not tender to palpation, positive bowel sounds  MSK: no peripheral edema  Neuro: CN 2-12 grossly intact, face activates symmetrically, 5/5  strength upper extremity, 4-/5 RLE, 5/5 LLE  Data Reviewed: Basic Metabolic Panel:  Recent Labs Lab 11/02/13 1700  NA 141  K 4.1  CL 103  CO2 26  GLUCOSE 108*  BUN 14  CREATININE 0.78  CALCIUM 9.2   Liver Function Tests:  Recent Labs Lab 11/02/13 1700  AST 17  ALT 10  ALKPHOS 48  BILITOT 0.3  PROT 6.1  ALBUMIN 3.3*   CBC:  Recent Labs Lab 11/02/13 1700    WBC 5.2  HGB 12.4  HCT 37.3  MCV 92.3  PLT 160   CBG:  Recent Labs Lab 11/02/13 2258 11/03/13 0738  GLUCAP 128* 96    Recent Results (from the past 240 hour(s))  MRSA PCR SCREENING     Status: Abnormal   Collection Time    11/03/13  3:01 AM      Result Value Range Status   MRSA by PCR POSITIVE (*) NEGATIVE Final   Comment:            The GeneXpert MRSA Assay (FDA     approved for NASAL specimens     only), is one component of a     comprehensive MRSA colonization     surveillance program. It is not     intended to diagnose MRSA     infection nor to guide or     monitor treatment for     MRSA infections.     RESULT CALLED TO, READ BACK BY AND VERIFIED WITH:     C SIMON AT 0719 ON 01.31.2015 BY NBROOKS     Studies: Dg Chest 2 View  11/02/2013   CLINICAL DATA:  Chest pain and fever.  EXAM: CHEST  2 VIEW  COMPARISON:  10/19/2012  FINDINGS: There is a left chest wall pacer device with lead in the right atrial appendage and right ventricle. The heart size appears normal. Small bilateral pleural effusions are identified. Pulmonary vascular congestion without over edema is noted. The visualized osseous structures are remarkable for scoliosis and multilevel spondylosis.  IMPRESSION: 1. Small bilateral pleural effusions and pulmonary vascular congestion.   Electronically Signed   By: Signa Kellaylor  Stroud M.D.   On: 11/02/2013 17:20   Ct Head Wo Contrast  11/02/2013   CLINICAL DATA:  Loss of vision in the left eye.  EXAM: CT HEAD WITHOUT CONTRAST  TECHNIQUE: Contiguous axial images were obtained from the base of the skull through the vertex without intravenous contrast.  COMPARISON:  None.  FINDINGS: Ventricles are normal in configuration. There is ventricular and sulcal enlargement reflecting mild, age related atrophy. No hydrocephalus.  There are no parenchymal masses or mass effect. Patchy periventricular white matter hypoattenuation is noted consistent with mild chronic microvascular  ischemic change. There is no evidence of a cortical infarct.  No extra-axial masses or abnormal fluid collections.  There is no intracranial hemorrhage.  Globes and orbits are unremarkable.  Visualized sinuses and mastoid air cells are clear.  IMPRESSION: 1. No acute intracranial abnormalities. 2. No significant abnormality of either globe or orbit. 3. Mild atrophy and mild chronic microvascular ischemic change.   Electronically Signed   By: Amie Portlandavid  Ormond M.D.   On: 11/02/2013 16:44    Scheduled Meds: . dipyridamole-aspirin  1 capsule Oral BID  . docusate sodium  200 mg Oral QHS  . enoxaparin (LOVENOX) injection  40 mg Subcutaneous QHS  . furosemide  20 mg Oral Daily  . isosorbide mononitrate  30 mg Oral QHS  . levothyroxine  25 mcg Oral QAC breakfast  . metoprolol succinate  50 mg Oral Daily  . morphine  15 mg Oral BID  . pantoprazole  40 mg Oral Daily  . predniSONE  40 mg Oral Q breakfast  . [START ON 11/08/2013] predniSONE  5 mg Oral Q breakfast   Continuous Infusions:   Principal Problem:   CVA (cerebral vascular accident) Active Problems:   Allergic reaction   HTN (hypertension)   Pacemaker   Lupus   Rash and nonspecific skin eruption  Time spent: 35  Pamella Pert, MD [facility] Pager [phone]. If 7 PM - 7 AM, please contact night-coverage at [email or web address], password [facility] 11/03/2013, 8:04 AM  LOS: 1 day

## 2013-11-03 NOTE — ED Provider Notes (Signed)
See prior note   Tonya GivensIva L Kiona Blume, MD 11/03/13 [phone]

## 2013-11-03 NOTE — Progress Notes (Signed)
Patient transferred to [address] [place]. Report called to receiving RN Alvera Novel(Joel Resuello,RN) and [facility] notified to transport pt. Pt is alert and oriented x4, family at bedside. No complain.

## 2013-11-04 ENCOUNTER — Observation Stay (HOSPITAL_COMMUNITY): Payer: Medicare Other

## 2013-11-04 DIAGNOSIS — M6281 Muscle weakness (generalized): Secondary | ICD-10-CM

## 2013-11-04 DIAGNOSIS — R531 Weakness: Secondary | ICD-10-CM | POA: Diagnosis present

## 2013-11-04 LAB — COMPREHENSIVE METABOLIC PANEL WITH GFR
ALT: 8 U/L (ref 0–35)
AST: 12 U/L (ref 0–37)
Albumin: 2.7 g/dL — ABNORMAL LOW (ref 3.5–5.2)
Alkaline Phosphatase: 37 U/L — ABNORMAL LOW (ref 39–117)
BUN: 15 mg/dL (ref 6–23)
CO2: 27 meq/L (ref 19–32)
Calcium: 8.2 mg/dL — ABNORMAL LOW (ref 8.4–10.5)
Chloride: 105 meq/L (ref 96–112)
Creatinine, Ser: 0.78 mg/dL (ref 0.50–1.10)
GFR calc Af Amer: 87 mL/min — ABNORMAL LOW
GFR calc non Af Amer: 75 mL/min — ABNORMAL LOW
Glucose, Bld: 88 mg/dL (ref 70–99)
Potassium: 3.6 meq/L — ABNORMAL LOW (ref 3.7–5.3)
Sodium: 143 meq/L (ref 137–147)
Total Bilirubin: <0.2 mg/dL — ABNORMAL LOW (ref 0.3–1.2)
Total Protein: 5.2 g/dL — ABNORMAL LOW (ref 6.0–8.3)

## 2013-11-04 LAB — CBC WITH DIFFERENTIAL/PLATELET
Basophils Absolute: 0 10*3/uL (ref 0.0–0.1)
Basophils Relative: 0 % (ref 0–1)
EOS ABS: 0.2 10*3/uL (ref 0.0–0.7)
EOS PCT: 3 % (ref 0–5)
HCT: 30.4 % — ABNORMAL LOW (ref 36.0–46.0)
Hemoglobin: 10.6 g/dL — ABNORMAL LOW (ref 12.0–15.0)
LYMPHS ABS: 2.1 10*3/uL (ref 0.7–4.0)
Lymphocytes Relative: 32 % (ref 12–46)
MCH: 31.9 pg (ref 26.0–34.0)
MCHC: 34.9 g/dL (ref 30.0–36.0)
MCV: 91.6 fL (ref 78.0–100.0)
MONOS PCT: 7 % (ref 3–12)
Monocytes Absolute: 0.5 10*3/uL (ref 0.1–1.0)
Neutro Abs: 3.8 10*3/uL (ref 1.7–7.7)
Neutrophils Relative %: 57 % (ref 43–77)
PLATELETS: 170 10*3/uL (ref 150–400)
RBC: 3.32 MIL/uL — AB (ref 3.87–5.11)
RDW: 12.6 % (ref 11.5–15.5)
WBC: 6.6 10*3/uL (ref 4.0–10.5)

## 2013-11-04 LAB — GLUCOSE, CAPILLARY
GLUCOSE-CAPILLARY: 129 mg/dL — AB (ref 70–99)
GLUCOSE-CAPILLARY: 110 mg/dL — AB (ref 70–99)
Glucose-Capillary: 88 mg/dL (ref 70–99)
Glucose-Capillary: 128 mg/dL — ABNORMAL HIGH (ref 70–99)

## 2013-11-04 MED ORDER — LORAZEPAM 0.5 MG PO TABS
0.2500 mg | ORAL_TABLET | Freq: Three times a day (TID) | ORAL | Status: DC | PRN
  Administered 2013-11-06: 0.5 mg via ORAL
  Filled 2013-11-04: qty 1

## 2013-11-04 MED ORDER — POTASSIUM CHLORIDE CRYS ER 20 MEQ PO TBCR
40.0000 meq | EXTENDED_RELEASE_TABLET | Freq: Once | ORAL | Status: AC
  Administered 2013-11-04: 40 meq via ORAL
  Filled 2013-11-04 (×2): qty 2

## 2013-11-04 MED ORDER — ACETAMINOPHEN 325 MG PO TABS
650.0000 mg | ORAL_TABLET | Freq: Four times a day (QID) | ORAL | Status: DC | PRN
  Administered 2013-11-04 – 2013-11-07 (×4): 650 mg via ORAL
  Filled 2013-11-04 (×2): qty 2

## 2013-11-04 NOTE — Evaluation (Addendum)
Physical Therapy Evaluation Patient Details Name: Tonya Hammond MRN: [id number] DOB: [birth year] Today's Date: 11/04/2013 Time: 4098-1191 PT Time Calculation (min): 28 min  PT Assessment / Plan / Recommendation History of Present Illness  Tonya Hammond is a 80 y.o. female with a past medical history of pacemaker implant, sick sinus syndrome, hypertension, Lupus, presenting in this evening to the emergency department as a transfer from an assisted-living facility. she reports noticing a rash over her face this morning which she initially attributed to butterfly rash from lupus. Over the course of the morning her rash spread down to upper part of her trunk and to bilateral extremities. She was administered Benadryl by nursing home staff at approximately 1:30 PM. She states that she was started on Plavix therapy approximately 2 weeks ago. While in the emergency room she was noted to have left-sided facial droop as well as right lower extremity weakness which gradually resolved. During my encounter she reports resolution to facial droop although continues to have right lower extremity weakness. Patient was recently admitted to [facility] in December 2014 at which time she was treated for transient ischemic attack. She recently underwent Cardiolite stress test in December as well which did not show evidence for ischemia or old defect. Because of history of pacemaker implant, MRI cannot be done.      Clinical Impression  Patient demonstrates deficits in functional mobility as indicated below. Will benefit from continued skilled PT to address deficits and maximize function, will continue to see as indicated and progress as tolerated. Patient and daughter indicated patient allergy to the bed linens, therefore, patient left up in chair following assessment.     PT Assessment  Patient needs continued PT services    Follow Up Recommendations  Home health PT (Return to ALF with HHPT)           Equipment Recommendations  None recommended by PT    Recommendations for Other Services     Frequency Min 3X/week    Precautions / Restrictions Precautions Precautions: Fall   Pertinent Vitals/Pain Patient reports dull headache        Mobility  Bed Mobility Overal bed mobility: Modified Independent General bed mobility comments: increased time to perform Transfers Overall transfer level: Needs assistance Equipment used: 4-wheeled walker Transfers: Sit to/from Stand Sit to Stand: Supervision General transfer comment: VCs for safety and hand placement, patient abandons rollator during mobility Ambulation/Gait Ambulation/Gait assistance: Supervision Ambulation Distance (Feet): 160 Feet Assistive device: 4-wheeled walker Gait Pattern/deviations: Step-through pattern;Decreased stride length;Trunk flexed;Narrow base of support Gait velocity: decreased Gait velocity interpretation: <1.8 ft/sec, indicative of risk for recurrent falls General Gait Details: patient with one seated rest break, complains of increased HR although VS indicated HR in 70s and SpO2 from 89 to 94% on room air with activity    Exercises     PT Diagnosis: Abnormality of gait;Generalized weakness  PT Problem List: Decreased strength;Decreased activity tolerance;Decreased mobility;Decreased knowledge of use of DME;Decreased safety awareness PT Treatment Interventions: DME instruction;Gait training;Stair training;Functional mobility training;Therapeutic activities;Therapeutic exercise;Balance training;Patient/family education     PT Goals(Current goals can be found in the care plan section) Acute Rehab PT Goals Patient Stated Goal: To go home PT Goal Formulation: With patient/family Time For Goal Achievement: 11/18/13 Potential to Achieve Goals: Good  Visit Information  Last PT Received On: 11/04/13 Assistance Needed: +1 History of Present Illness: Tonya Hammond is a 80 y.o. female with a past  medical history of pacemaker implant, sick sinus syndrome, hypertension, Lupus, presenting  in this evening to the emergency department as a transfer from an assisted-living facility. she reports noticing a rash over her face this morning which she initially attributed to butterfly rash from lupus. Over the course of the morning her rash spread down to upper part of her trunk and to bilateral extremities. She was administered Benadryl by nursing home staff at approximately 1:30 PM. She states that she was started on Plavix therapy approximately 2 weeks ago. While in the emergency room she was noted to have left-sided facial droop as well as right lower extremity weakness which gradually resolved. During my encounter she reports resolution to facial droop although continues to have right lower extremity weakness. Patient was recently [facility] in December 2014 at which time she was treated for transient ischemic attack. She recently underwent Cardiolite stress test in December as well which did not show evidence for ischemia or old defect. Because of history of pacemaker implant, MRI cannot be done.         Prior Functioning  Home Living Family/patient expects to be discharged to:: Assisted living Home Equipment: Walker - 4 wheels Prior Function Level of Independence: Needs assistance Gait / Transfers Assistance Needed: per daughter, patient had assist for bathing and supervision for mobility Dominant Hand: Right    Cognition  Cognition Arousal/Alertness: Awake/alert Behavior During Therapy: Anxious Overall Cognitive Status: Impaired/Different from baseline Area of Impairment: Attention;Safety/judgement Current Attention Level: Selective Safety/Judgement: Decreased awareness of safety General Comments: Patient very easily distracted max cues to attend to task    Extremity/Trunk Assessment Upper Extremity Assessment Upper Extremity Assessment: Defer to OT evaluation Lower  Extremity Assessment Lower Extremity Assessment: Generalized weakness Cervical / Trunk Assessment Cervical / Trunk Assessment: Kyphotic   Balance Balance Overall balance assessment: History of Falls General Comments General comments (skin integrity, edema, etc.): Rash bilateral LEs  End of Session PT - End of Session Equipment Utilized During Treatment: Gait belt Activity Tolerance: Patient tolerated treatment well Patient left: in chair;with call bell/phone within reach;with family/visitor present Nurse Communication: Mobility status;Other (comment) (contact isolation indicated, allergies to meds, DNR )  GP Functional Assessment Tool Used: clinical judgement Functional Limitation: Mobility: Walking and moving around Mobility: Walking and Moving Around Current Status (W4132(G8978): At least 20 percent but less than 40 percent impaired, limited or restricted Mobility: Walking and Moving Around Goal Status [id number](G8979): At least 1 percent but less than 20 percent impaired, limited or restricted   Fabio AsaWerner, Toluwani Ruder J 11/04/2013, 9:44 AM Charlotte Crumbevon Zalen Sequeira, PT DPT  [phone]

## 2013-11-04 NOTE — Evaluation (Signed)
Clinical/Bedside Swallow Evaluation Patient Details  Name: Tonya Hammond MRN: [id number] Date of Birth: 1927/09/05  Today's Date: 11/04/2013 Time: 1205-1220 SLP Time Calculation (min): 15 min  Past Medical History:  Past Medical History  Diagnosis Date  . Pacemaker 2010    syncope  . Scoliosis   . Hypothyroidism   . HTN (hypertension)   . Sjogren's disease   . Complication of anesthesia     difficulty waking "  . Dysrhythmia     2n degree heart block  . GERD (gastroesophageal reflux disease)   . H/O hiatal hernia   . Arthritis   . Anemia   . S/P cardiac cath     a. Reported cardiac cath in 2010 which showed one blockage of 50%.   Past Surgical History:  Past Surgical History  Procedure Laterality Date  . Appendectomy    . Knee arthroscopy    . Eye surgery    . Cholecystectomy    . Abdominal hysterectomy    . Tonsillectomy     HPI:  80 year old female presenting with left sided weakness and facial droop. Patient was recently admitted to [facility] in December 2014 at which time she was treated for transient ischemic attack. Acute Head CT without any acute findings. CXR: Small bilateral pleural effusions and pulmonary vascular    Assessment / Plan / Recommendation Clinical Impression  Patient presents with a functional oropharyngeal swallow. Reports that increased dry mouth due to current Benedryl use contributing to increased difficulty swallowing. Patient is compensating well by utilizing intra-oral mouth moisturizer as well as taking frequent sips of liquid. Reports a decreased in lingual strength however no focal deficits noted by this SLP. Education complete with patient and daughter regarding potential effects of TIA on oral strength and likelihood of rapid recovery. No SLP f/u indicated at this time.     Aspiration Risk       Diet Recommendation Regular;Thin liquid   Liquid Administration via: Cup;Straw Medication Administration: Whole meds with  liquid Supervision: Patient able to self feed Compensations: Slow rate;Small sips/bites;Follow solids with liquid (begin meal with thin liquids) Postural Changes and/or Swallow Maneuvers: Seated upright 90 degrees    Other  Recommendations Oral Care Recommendations: Oral care BID (at minimun)   Follow Up Recommendations  None    Frequency and Duration        Pertinent Vitals/Pain n/a        Swallow Study    General HPI: 80 year old female presenting with left sided weakness and facial droop. Patient was recently admitted to [facility] in December 2014 at which time she was treated for transient ischemic attack. Acute Head CT without any acute findings. CXR: Small bilateral pleural effusions and pulmonary vascular  Type of Study: Bedside swallow evaluation Previous Swallow Assessment: none. patient passed RN stroke swallow screen however reported to MD increased difficulty swallowing per MD report.  Diet Prior to this Study: Regular;Thin liquids Temperature Spikes Noted: No Respiratory Status: Room air History of Recent Intubation: No Behavior/Cognition: Alert;Cooperative;Pleasant mood Oral Cavity - Dentition: Adequate natural dentition (partials, not in place) Self-Feeding Abilities: Able to feed self Patient Positioning: Upright in bed Baseline Vocal Quality: Clear Volitional Cough: Strong Volitional Swallow: Able to elicit    Oral/Motor/Sensory Function Overall Oral Motor/Sensory Function: Appears within functional limits for tasks assessed (dry mouth)   Ice Chips Ice chips: Not tested   Thin Liquid Thin Liquid: Within functional limits Presentation: Cup;Self Fed;Straw    Nectar Thick Nectar Thick  Liquid: Not tested   Honey Thick Honey Thick Liquid: Not tested   Puree Puree: Within functional limits Presentation: Self Fed;Spoon   Solid   GO Functional Assessment Tool Used: skilled clinical judgement Functional Limitations: Swallowing Swallow Current  Status (Z6109(G8996): At least 1 percent but less than 20 percent impaired, limited or restricted Swallow Goal Status [id number](G8997): At least 1 percent but less than 20 percent impaired, limited or restricted Swallow Discharge Status [id number](G8998): At least 1 percent but less than 20 percent impaired, limited or restricted  Solid: Within functional limits Presentation: Self Fed      Tonya Gelinas MA, CCC-SLP [phone]  Tonya Hammond 11/04/2013,12:59 PM

## 2013-11-04 NOTE — Progress Notes (Signed)
*  PRELIMINARY RESULTS* Vascular Ultrasound Carotid Duplex (Doppler) has been completed.   Findings suggest 1-39% internal carotid artery stenosis bilaterally. Vertebral arteries are patent with antegrade flow.  11/04/2013 11:49 AM Gertie FeyMichelle Cherylyn Sundby, RVT, RDCS, RDMS

## 2013-11-04 NOTE — Progress Notes (Signed)
[facility] PROGRESS NOTE  Tonya Hammond GEX:[id number] DOB: Nov 07, 1926 DOA: 11/02/2013 PCP: Joycelyn Rua, MD  Assessment/Plan: #1 right-sided weakness Clinical improvement. Patient and daughter stated that when she had the right-sided weakness in the emergency room daughter states that patient had a stiffening of her right upper extremity right lower extremity with some associated confusion after that. Concern also is for possible seizures versus stroke. Repeat CT of the head was negative. Patient unable to get an MRI of the head secondary to pacemaker.?? CT head tilted will defer to neurology. 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 Patient had bleeding in eye to eliquis. Discontinue Aggrenox. Patient was recently placed on Plavix and had presented to the ED secondary to possible allergic reaction causing a rash. Will check an EEG to rule out seizures. Carotid Doppler is pending. 2-D echo with no source of emboli. Neurology is following and appreciate input and recommendations.  #2 rash Possible drug reaction to Plavix. Rash has improved with Benadryl and discontinuation of Plavix. Continue prednisone. Follow.  #3 hypertension Stable. Continue imdur, Lopressor, lasix.  #4 hypothyroidism TSH within normal limits at 0.709. Continue Synthroid.  #5  sinus syndrome status post pacemaker/coronary artery disease Stable. Continue Lopressor, Lasix, imdur.  #6 history of lupus Continue steroid therapy. Outpatient followup.  #7 prophylaxis SCDs for DVT prophylaxis.  Code Status: DO NOT RESUSCITATE Family Communication: Updated patient and daughter at bedside. Disposition Plan: Back to assisted living facility when medically stable.   Consultants:  Neurology: Dr. Cyril Mourning 11/03/2013  Procedures:  CT head 11/02/2013, 11/04/2013  Chest x-ray 11/02/2013  2-D echo 11/03/2013  Antibiotics:  None  HPI/Subjective: Patient states right-sided  weakness has improved. Patient states rash has improved. Patient states has an allergy to aspirin causing a diffuse rash and per daughter anaphalyxis.  Objective: Filed Vitals:   11/04/13 0952  BP: 145/71  Pulse: 78  Temp: 98.2 F (36.8 C)  Resp: 18    Intake/Output Summary (Last 24 hours) at 11/04/13 1618 Last data filed at 11/03/13 1700  Gross per 24 hour  Intake    240 ml  Output      0 ml  Net    240 ml   Filed Weights   11/02/13 2230  Weight: 60.691 kg (133 lb 12.8 oz)    Exam:   General:  NAD. Rash improved.  Cardiovascular: RRR  Respiratory: CTA B.  Abdomen: Soft, nontender, nondistended, positive bowel sounds  Musculoskeletal: No clubbing cyanosis or edema.  Data Reviewed: Basic Metabolic Panel:  Recent Labs Lab 11/02/13 1700 11/04/13 0530  NA 141 143  K 4.1 3.6*  CL 103 105  CO2 26 27  GLUCOSE 108* 88  BUN 14 15  CREATININE 0.78 0.78  CALCIUM 9.2 8.2*   Liver Function Tests:  Recent Labs Lab 11/02/13 1700 11/04/13 0530  AST 17 12  ALT 10 8  ALKPHOS 48 37*  BILITOT 0.3 <0.2*  PROT 6.1 5.2*  ALBUMIN 3.3* 2.7*   No results found for this basename: LIPASE, AMYLASE,  in the last 168 hours No results found for this basename: AMMONIA,  in the last 168 hours CBC:  Recent Labs Lab 11/02/13 1700 11/04/13 0530  WBC 5.2 6.6  NEUTROABS  --  3.8  HGB 12.4 10.6*  HCT 37.3 30.4*  MCV 92.3 91.6  PLT 160 170   Cardiac Enzymes: No results found for this basename: CKTOTAL, CKMB, CKMBINDEX, TROPONINI,  in the last 168 hours BNP (last 3 results) No results  found for this basename: PROBNP,  in the last 8760 hours CBG:  Recent Labs Lab 11/03/13 0738 11/03/13 1129 11/03/13 1645 11/04/13 0930 11/04/13 1344  GLUCAP 96 102* 144* 88 129*    Recent Results (from the past 240 hour(s))  MRSA PCR SCREENING     Status: Abnormal   Collection Time    11/03/13  3:01 AM      Result Value Range Status   MRSA by PCR POSITIVE (*) NEGATIVE Final    Comment:            The GeneXpert MRSA Assay (FDA     approved for NASAL specimens     only), is one component of a     comprehensive MRSA colonization     surveillance program. It is not     intended to diagnose MRSA     infection nor to guide or     monitor treatment for     MRSA infections.     RESULT CALLED TO, READ BACK BY AND VERIFIED WITH:     C SIMON AT 0719 ON 01.31.2015 BY NBROOKS     Studies: Dg Chest 2 View  11/02/2013   CLINICAL DATA:  Chest pain and fever.  EXAM: CHEST  2 VIEW  COMPARISON:  10/19/2012  FINDINGS: There is a left chest wall pacer device with lead in the right atrial appendage and right ventricle. The heart size appears normal. Small bilateral pleural effusions are identified. Pulmonary vascular congestion without over edema is noted. The visualized osseous structures are remarkable for scoliosis and multilevel spondylosis.  IMPRESSION: 1. Small bilateral pleural effusions and pulmonary vascular congestion.   Electronically Signed   By: Signa Kellaylor  Stroud M.D.   On: 11/02/2013 17:20   Ct Head Wo Contrast  11/04/2013   CLINICAL DATA:  Followup stroke. Headache in the region of the left eye.  EXAM: CT HEAD WITHOUT CONTRAST  TECHNIQUE: Contiguous axial images were obtained from the base of the skull through the vertex without intravenous contrast.  COMPARISON:  11/02/2013  FINDINGS: Ventricles are normal in configuration. There is ventricular and sulcal enlargement reflecting age related volume loss. No hydrocephalus.  There are no parenchymal masses or mass effect. Periventricular white matter hypoattenuation is noted consistent with chronic microvascular ischemic change there is no evidence of a cortical infarct.  There are no extra-axial masses or abnormal fluid collections.  There is no intracranial hemorrhage.  The visualized sinuses and mastoid air cells are clear.  IMPRESSION: 1. No acute intracranial abnormalities. 2. No change from the previous exam.   Electronically  Signed   By: Amie Portlandavid  Ormond M.D.   On: 11/04/2013 07:18   Ct Head Wo Contrast  11/02/2013   CLINICAL DATA:  Loss of vision in the left eye.  EXAM: CT HEAD WITHOUT CONTRAST  TECHNIQUE: Contiguous axial images were obtained from the base of the skull through the vertex without intravenous contrast.  COMPARISON:  None.  FINDINGS: Ventricles are normal in configuration. There is ventricular and sulcal enlargement reflecting mild, age related atrophy. No hydrocephalus.  There are no parenchymal masses or mass effect. Patchy periventricular white matter hypoattenuation is noted consistent with mild chronic microvascular ischemic change. There is no evidence of a cortical infarct.  No extra-axial masses or abnormal fluid collections.  There is no intracranial hemorrhage.  Globes and orbits are unremarkable.  Visualized sinuses and mastoid air cells are clear.  IMPRESSION: 1. No acute intracranial abnormalities. 2. No significant abnormality of either globe or  orbit. 3. Mild atrophy and mild chronic microvascular ischemic change.   Electronically Signed   By: Amie Portland M.D.   On: 11/02/2013 16:44    Scheduled Meds: . docusate sodium  200 mg Oral QHS  . furosemide  20 mg Oral Daily  . isosorbide mononitrate  30 mg Oral QHS  . levothyroxine  25 mcg Oral QAC breakfast  . metoprolol succinate  50 mg Oral Daily  . morphine  15 mg Oral BID  . pantoprazole  40 mg Oral Daily  . predniSONE  40 mg Oral Q breakfast   Continuous Infusions:   Principal Problem:   Right sided weakness Active Problems:   Allergic reaction   HTN (hypertension)   Pacemaker   Lupus   CVA (cerebral vascular accident)   Rash and nonspecific skin eruption    Time spent: 40 minutes    THOMPSON,DANIEL M.D. [facility] Pager [phone]. If 7PM-7AM, please contact night-coverage at [email or web address], password [facility] 11/04/2013, 4:18 PM  LOS: 2 days

## 2013-11-04 NOTE — Progress Notes (Signed)
Stroke Team Progress Note  HISTORY Tonya Hammond is an 80 y.o. female, right handed, with a past medical history significant for HTN, sick sinus syndrome, syncope, s/p pacemaker placement, hypothyroidism, Lupus, scoliosis, transferred [facility] 11/02/2013 for further evaluation of probable stroke.  Tonya Hammond is a resident at an assisted living facility and was initially evaluated [facility]/02/[facility] after noticing a rash in her face that was thought to be secondary to an allergic reaction to plavix. She said that while in the ED she experienced " a painful sensation in my right arm that was rapidly followed by pain-numbness-weakness of my right leg, mainly the right foot and I couldn't raise it".Stated that she was surprised by the severity of the weakness when she was examined by the ED physician that afternoon. Of note, there is conflicting report about whether or not she had a left face droop that resolved in the ED.   She said that she woke up on the morning of 11/02/2013 feeling " miserable", and recalled that " right after breakfast, perhaps around 830 am, I was confused and was having difficulty talking, things were coming out of my mouth differently".  Denied associated HA, vertigo, double vision, chest pain, or palpitations.  [facility] showed no acute abnormality.  TTE unremarkable.  Total cholesterol 138, triglycerides 66, HDL 41, LDL 84  [facility] due to concern for an acute stroke.  LAST KNOWN WELL: UNCERTAIN, PERHAPS 830 am 11/02/13  TPA not given due to unclear time of onset, late presentation.  SUBJECTIVE The patient's daughter is present this morning. The patient states that she had a rough night; however, she is unable to be specific. The patient's daughter reports that the patient is allergic to both aspirin and Plavix with a rash and tongue swelling associated with both medications. Also there was a question of right sided seizure-like activity [facility]. Check EEG.  OBJECTIVE Most recent Vital Signs: Filed Vitals:   11/03/13 1522 11/03/13 1800 11/03/13 2100 11/04/13 0508  BP: 130/58 121/50 166/95 139/49  Pulse: 75 78 73   Temp: 98.6 F (37 C) 97.3 F (36.3 C) 97.1 F (36.2 C) 97.6 F (36.4 C)  TempSrc: Oral Oral  Oral  Resp: 18 18 18 18   Height:      Weight:      SpO2: 98% 99% 100% 99%   CBG (last 3)   Recent Labs  11/03/13 0738 11/03/13 1129 11/03/13 1645  GLUCAP 96 102* 144*    IV Fluid Intake:     MEDICATIONS  . dipyridamole-aspirin  1 capsule Oral BID  . docusate sodium  200 mg Oral QHS  . enoxaparin (LOVENOX) injection  40 mg Subcutaneous QHS  . furosemide  20 mg Oral Daily  . isosorbide mononitrate  30 mg Oral QHS  . levothyroxine  25 mcg Oral QAC breakfast  . metoprolol succinate  50 mg Oral Daily  . morphine  15 mg Oral BID  . pantoprazole  40 mg Oral Daily  . predniSONE  40 mg Oral Q breakfast  . [START ON 11/08/2013] predniSONE  5 mg Oral Q breakfast   PRN:  diazepam, diphenhydrAMINE, morphine, nitroGLYCERIN, polyvinyl alcohol, sodium chloride  Diet:  Cardiac thin liquids Activity:  Bathroom privileges with assistance. DVT Prophylaxis:  Lovenox  CLINICALLY SIGNIFICANT STUDIES Basic Metabolic Panel:   Recent Labs Lab 11/02/13 1700 11/04/13 0530  NA 141 143  K 4.1 3.6*  CL 103 105  CO2 26 27  GLUCOSE 108* 88  BUN 14 15  CREATININE 0.78 0.78  CALCIUM 9.2 8.2*   Liver Function Tests:   Recent Labs Lab 11/02/13 1700 11/04/13 0530  AST 17 12  ALT 10 8  ALKPHOS 48 37*  BILITOT 0.3 <0.2*  PROT 6.1 5.2*  ALBUMIN 3.3* 2.7*   CBC:   Recent Labs Lab 11/02/13 1700 11/04/13 0530  WBC 5.2 6.6  NEUTROABS  --  3.8  HGB 12.4 10.6*  HCT 37.3 30.4*  MCV 92.3 91.6  PLT 160 170   Coagulation:   Recent Labs Lab 11/02/13 1700  LABPROT 13.5  INR 1.05   Cardiac Enzymes: No results found for this basename: CKTOTAL, CKMB, CKMBINDEX, TROPONINI,  in the last  168 hours Urinalysis:   Recent Labs Lab 11/02/13 1632  COLORURINE YELLOW  LABSPEC 1.006  PHURINE 7.0  GLUCOSEU NEGATIVE  HGBUR NEGATIVE  BILIRUBINUR NEGATIVE  KETONESUR NEGATIVE  PROTEINUR NEGATIVE  UROBILINOGEN 0.2  NITRITE NEGATIVE  LEUKOCYTESUR NEGATIVE   Lipid Panel    Component Value Date/Time   CHOL 138 11/03/2013 0550   TRIG 66 11/03/2013 0550   HDL 41 11/03/2013 0550   CHOLHDL 3.4 11/03/2013 0550   VLDL 13 11/03/2013 0550   LDLCALC 84 11/03/2013 0550   HgbA1C  Lab Results  Component Value Date   HGBA1C 5.6 11/03/2013    Urine Drug Screen:   No results found for this basename: labopia,  cocainscrnur,  labbenz,  amphetmu,  thcu,  labbarb    Alcohol Level: No results found for this basename: ETH,  in the last 168 hours  Dg Chest 2 View 11/02/2013    Small bilateral pleural effusions and pulmonary vascular congestion.     Ct Head Wo Contrast 11/04/2013    1. No acute intracranial abnormalities. 2. No change from the previous exam.     Ct Head Wo Contrast 11/02/2013    1. No acute intracranial abnormalities. 2. No significant abnormality of either globe or orbit. 3. Mild atrophy and mild chronic microvascular ischemic change.    MRI of the brain  permanent pacemaker  MRA of the brain  permanent pacemaker  2D Echocardiogram  ejection fraction 55-60%. No cardiac source of emboli identified.  Carotid Doppler  pending  EKG sinus rhythm rate 71 beats per minute  Therapy Recommendations - pending  Physical Exam   Neurologic Examination:  Mental Status:  Alert, oriented, thought content appropriate. Speech fluent without evidence of aphasia. Able to follow 3 step commands without difficulty.  Cranial Nerves:  II: Discs flat bilaterally; Visual fields grossly normal, pupils equal, round, reactive to light and accommodation  III,IV, VI: ptosis not present, extra-ocular motions intact bilaterally  V,VII: smile symmetric, facial light touch sensation normal  bilaterally  VIII: hearing normal bilaterally  IX,X: gag reflex present  XI: bilateral shoulder shrug  XII: midline tongue extension without atrophy or fasciculations  Motor:  Significant for right leg weakness.  Tone and bulk:normal tone throughout  Sensory: Pinprick and light touch intact throughout, bilaterally  Deep Tendon Reflexes:  Right: Upper Extremity Left: Upper extremity  biceps (C-5 to C-6) 2/4 biceps (C-5 to C-6) 2/4  tricep (C7) 2/4 triceps (C7) 2/4  Brachioradialis (C6) 2/4 Brachioradialis (C6) 2/4  Lower Extremity Lower Extremity  quadriceps (L-2 to L-4) 2/4 quadriceps (L-2 to L-4) 2/4  Achilles (S1) 2/4 Achilles (S1) 2/4  Plantars:  Right: downgoing Left: downgoing  Cerebellar:  normal finger-to-nose, normal heel-to-shin test  Gait:  Not tested.  ASSESSMENT Tonya Hammond is a 80 y.o. female presenting with right hemiparesis. TPA was not administered as the patient was outside the window for treatment. CTs of the head revealed no acute abnormalities. Cannot have MRI secondary to a permanent pacemaker. Consider CT angiogram. Infarct felt to be thrombotic.  On clopidogrel 75 mg orally every day prior to admission. Now on dipyridamole SR 250 mg/aspirin 25 mg orally twice a day for secondary stroke prevention. Patient with resultant right lower extremity weakness. Work up underway.   Permanent pacemaker for sick sinus syndrome  Hypertension history  Lupus  TIA December 2014 - Plavix therapy initiated at that time.  Anemia - hemoglobin 10.6 hematocrit 30.4  Small bilateral pleural effusions and pulmonary vascular congestion by chest x-ray  Reported allergy to aspirin and Plavix manifested as rash and tongue swelling.  Hospital day # 2  TREATMENT/PLAN  D/C dipyridamole SR 250 mg/aspirin 25 mg orally twice a day for secondary stroke prevention due to ASA allergy.  Brilinta (ticagrelor) 90 mg BID per Dr Loretha Brasil for secondary stroke  prevention  Await carotid Dopplers  Await therapy evaluations  Consider CT angiogram to further evaluate stroke with negative head CTs x2 and a permanent pacemaker.  EEG for possible seizure activity.   Delton See PA-C [facility] Pager [phone] 11/04/2013, 9:29 AM  I have personally obtained a history, examined the patient, evaluated imaging results, and formulated the assessment and plan of care. I agree with the above.  Pt has allergies to both plavix and ASA. As per daughter allergies include tongue swelling and rash. Will start Ticagrelor 90mg  BID without the loading dose.   Can check P2Y12 tomorrow to see its affect.  Pauletta Browns

## 2013-11-04 NOTE — Progress Notes (Signed)
Pt notified nurse that she "felt funny." Pt said she was unable to describe exactly how she felt but "nauseated" came to mind. Pt decline antiemetics stating its more of her "heart racing." HR 80, BP 160/63. O2 on RA 99%, respirations 16 and temperature 97. No signs of distress noted. Heart sounds WNL. Will continue to monitor. Herma ArdMesser, Sigmund Morera RN, BSN

## 2013-11-05 ENCOUNTER — Observation Stay (HOSPITAL_COMMUNITY): Payer: Medicare Other

## 2013-11-05 DIAGNOSIS — T7840XA Allergy, unspecified, initial encounter: Secondary | ICD-10-CM

## 2013-11-05 LAB — CBC
HCT: 34.5 % — ABNORMAL LOW (ref 36.0–46.0)
Hemoglobin: 11.6 g/dL — ABNORMAL LOW (ref 12.0–15.0)
MCH: 31.2 pg (ref 26.0–34.0)
MCHC: 33.6 g/dL (ref 30.0–36.0)
MCV: 92.7 fL (ref 78.0–100.0)
PLATELETS: 202 10*3/uL (ref 150–400)
RBC: 3.72 MIL/uL — ABNORMAL LOW (ref 3.87–5.11)
RDW: 12.6 % (ref 11.5–15.5)
WBC: 7.3 10*3/uL (ref 4.0–10.5)

## 2013-11-05 LAB — BASIC METABOLIC PANEL
BUN: 16 mg/dL (ref 6–23)
CALCIUM: 8.4 mg/dL (ref 8.4–10.5)
CO2: 24 mEq/L (ref 19–32)
Chloride: 106 mEq/L (ref 96–112)
Creatinine, Ser: 0.69 mg/dL (ref 0.50–1.10)
GFR calc Af Amer: >90 mL/min (ref >90)
GFR, EST NON AFRICAN AMERICAN: 78 mL/min — AB (ref >90)
GLUCOSE: 85 mg/dL (ref 70–99)
POTASSIUM: 4.1 meq/L (ref 3.7–5.3)
SODIUM: 143 meq/L (ref 137–147)

## 2013-11-05 LAB — GLUCOSE, CAPILLARY
GLUCOSE-CAPILLARY: 135 mg/dL — AB (ref 70–99)
GLUCOSE-CAPILLARY: 147 mg/dL — AB (ref 70–99)
Glucose-Capillary: 80 mg/dL (ref 70–99)

## 2013-11-05 MED ORDER — CHLORHEXIDINE GLUCONATE CLOTH 2 % EX PADS: 6.0000 | MEDICATED_PAD | Freq: Every day | CUTANEOUS | Status: DC

## 2013-11-05 MED ORDER — MUPIROCIN 2 % EX OINT
1.0000 "application " | TOPICAL_OINTMENT | Freq: Two times a day (BID) | CUTANEOUS | Status: DC
  Administered 2013-11-05 – 2013-11-07 (×5): 1 via NASAL
  Filled 2013-11-05: qty 22

## 2013-11-05 MED ORDER — METOPROLOL SUCCINATE ER 50 MG PO TB24
50.0000 mg | ORAL_TABLET | Freq: Every day | ORAL | Status: DC
  Administered 2013-11-06: 50 mg via ORAL
  Filled 2013-11-05 (×2): qty 1

## 2013-11-05 NOTE — Progress Notes (Signed)
Nutrition Education Note  RD consulted for nutrition education per patient request. RD answered pt's questions regarding sodium and heart healthy diet. Reviewed patient's dietary recall. Provided examples on ways to decrease sodium and fat intake in diet. Discouraged intake of processed foods and use of salt shaker. Encouraged fresh fruits and vegetables to maximize fiber intake. RD provided "Heart Healthy Nutrition Therapy" handout from the Academy of [employer] and Dietetics. Teach back method used.  Expect good compliance.  Body mass index is 26.13 kg/(m^2). Pt meets criteria for Overweight based on current BMI.  Current diet order is Heart healthy, patient is consuming approximately 50-100% of meals at this time. Pt reports having a good appetite. Labs and medications reviewed. No further nutrition interventions warranted at this time. RD contact information provided. If additional nutrition issues arise, please re-consult RD.  Ian Malkineanne Barnett RD, LDN Inpatient Clinical Dietitian Pager: [phone] After Hours Pager: [phone]

## 2013-11-05 NOTE — Progress Notes (Signed)
Clinical Social Work Department BRIEF PSYCHOSOCIAL ASSESSMENT 11/05/2013  Patient:  Tonya Hammond,Tonya Hammond     Account Number:  [id number]     Admit date:  11/02/2013  Clinical Social Worker:  Varney BilesANDERSON,Clayton Bosserman, LCSWA  Date/Time:  11/05/2013 02:28 PM  Referred by:  Physician  Date Referred:  11/05/2013 Referred for  ALF Placement   Other Referral:   Interview type:  Patient Other interview type:   Pt's daughter also in room    PSYCHOSOCIAL DATA Living Status:  FACILITY Admitted from facility:  [facility] Level of care:  Assisted Living Primary support name:  Tonya Hammond [phone]) Tonya Hammond [phone]) Primary support relationship to patient:  CHILD, ADULT Degree of support available:   Good--pt from [facility] and daughters Tonya and Tonya Hammond are involved in her care.    CURRENT CONCERNS Current Concerns  Post-Acute Placement   Other Concerns:    SOCIAL WORK ASSESSMENT / PLAN CSW spoke with pt and her daughter. Pt explains she is from [facility] and the plan is to return when ready for discharge. CSW explained that CSW called admissions and left a voicemail, and pt states staff from the facility came by the hospital to evaluate her this morning. Pt anticipates that she will be fine going back, as this is what PT/OT recommend here inthe hospital. Pt had some complaints about her experience while hospitalized, explaining she is allergic to polyester and that the polyester gowns irritate her skin. Pt states she was put in a polyester gown and that this made her sweat and she was very uncomfortable. CSW provided support and talked with pt extensively about her experience during this hospitalization. Pt explained she is eager to get back to [facility], and CSW explained that CSW will continue to check in with her while she is in the hospital and take care of the paperwork for her to go back. Pt and daughter Tonya Hammond explain that when pt is ready for discharge,  PTAR should be utilized because pt relies heavily on other daughter for support, "maybe too heavily" per Tonya Hammond, and that non-emergent ambulance transport would be ideal. CSW stated this would not be a problem. Tonya Hammond requested call when pt is discharging if it is after Wednesday, as she is visiting from out of town and will be leaving on Wed, and CSW will do this as well.   Assessment/plan status:  Psychosocial Support/Ongoing Assessment of Needs Other assessment/ plan:   Information/referral to community resources:   ALF [facility]    PATIENT'S/FAMILY'S RESPONSE TO PLAN OF CARE: Good--pt and daughter friendly and joked with CSW. Engaged in conversation, and pt states she is eager to return back to ALF. Pt and daughter thanked CSW for assistance.       Maryclare LabradorJulie Najmo Pardue, MSW, [facility] Clinical Social Worker [phone]

## 2013-11-05 NOTE — Progress Notes (Signed)
PT Cancellation Note  Patient Details Name: Tonya SantosClara Hammond MRN: [id number] DOB: 23-Apr-1927   Cancelled Treatment:    Reason Eval/Treat Not Completed: Patient at procedure or test/unavailable, EEG will follow up later this am   Fabio AsaWerner, Candra Wegner J 11/05/2013, 8:56 AM [name], PT DPT  [phone]

## 2013-11-05 NOTE — Evaluation (Addendum)
Occupational Therapy Evaluation Patient Details Name: Tonya Hammond Hammond MRN: [id number] DOB: May 09, 1927 Today's Date: 11/05/2013 Time: [id number] OT Time Calculation (min): 30 min  OT Assessment / Plan / Recommendation History of present illness Tonya Hammond [place] is a 80 y.o. female with a past medical history of pacemaker implant, sick sinus syndrome, hypertension, Lupus, presenting in this evening to the emergency department as a transfer from an assisted-living facility. she reports noticing a rash over her face this morning which she initially attributed to butterfly rash from lupus. Over the course of the morning her rash spread down to upper part of her trunk and to bilateral extremities. She was administered Benadryl by nursing home staff at approximately 1:30 PM. She states that she was started on Plavix therapy approximately 2 weeks ago. While in the emergency room she was noted to have left-sided facial droop as well as right lower extremity weakness which gradually resolved. During my encounter she reports resolution to facial droop although continues to have right lower extremity weakness. Patient was recently [facility] in December 2014 at which time she was treated for transient ischemic attack. She recently underwent Cardiolite stress test in December as well which did not show evidence for ischemia or old defect. Because of history of pacemaker implant, MRI cannot be done.     Clinical Impression   Pt presents with below problem list. Feel pt will benefit from acute OT to increase independence prior to d/c.     OT Assessment  Patient needs continued OT Services    Follow Up Recommendations  No OT follow up;Supervision/Assistance - 24 hour    Barriers to Discharge      Equipment Recommendations  None recommended by OT    Recommendations for Other Services    Frequency  Min 2X/week    Precautions / Restrictions Precautions Precautions:  Fall Restrictions Weight Bearing Restrictions: No   Pertinent Vitals/Pain Back pain. Nurse notified for pain meds.     ADL  Grooming: Teeth care;Denture care;Min guard;Brushing hair Where Assessed - Grooming: Supported standing Upper Body Dressing: Set up Where Assessed - Upper Body Dressing: Supported sitting Lower Body Dressing: Moderate assistance Where Assessed - Lower Body Dressing: Supported sit to stand Toilet Transfer: Hydrographic surveyorMin guard Toilet Transfer Method: Sit to Barist[place]and Toilet Transfer Equipment:  (chair) Tub/Shower Transfer Method: Not assessed Equipment Used: Gait belt;Other (comment) (rollator) Transfers/Ambulation Related to ADLs: Min guard for transfers. Assistance to position body and walker management when stepping back to simulate toilet transfer.   ADL Comments: Discussed sock aid to assist with LB dressing as well as use of reacher. Recommended sitting for dressing. Educated on energy conservation techniques during session.    OT Diagnosis: Generalized weakness;Acute pain  OT Problem List: Decreased strength;Decreased activity tolerance;Impaired balance (sitting and/or standing);Decreased knowledge of use of DME or AE;Decreased knowledge of precautions;Decreased safety awareness;Pain OT Treatment Interventions: Self-care/ADL training;Therapeutic exercise;DME and/or AE instruction;Therapeutic activities;Visual/perceptual remediation/compensation;Patient/family education;Balance training;Energy conservation   OT Goals(Current goals can be found in the care plan section) Acute Rehab OT Goals Patient Stated Goal: not stated OT Goal Formulation: With patient Time For Goal Achievement: 11/12/13 Potential to Achieve Goals: Good ADL Goals Pt Will Perform Upper Body Bathing: with modified independence;sitting Pt Will Perform Lower Body Bathing: sit to/from stand;with supervision Pt Will Perform Lower Body Dressing: sit to/from stand;with modified independence Pt Will Transfer  to Toilet: with modified independence;ambulating Pt Will Perform Toileting - Clothing Manipulation and hygiene: with modified independence;sit to/from stand Additional ADL Goal #1: Pt  will independently verbalize and demonstrate 3/3 energy conservation techniques.  Visit Information  Last OT Received On: 11/05/13 Assistance Needed: +1 History of Present Illness: Tonya Hammond is a 80 y.o. female with a past medical history of pacemaker implant, sick sinus syndrome, hypertension, Lupus, presenting in this evening to the emergency department as a transfer from an assisted-living facility. she reports noticing a rash over her face this morning which she initially attributed to butterfly rash from lupus. Over the course of the morning her rash spread down to upper part of her trunk and to bilateral extremities. She was administered Benadryl by nursing home staff at approximately 1:30 PM. She states that she was started on Plavix therapy approximately 2 weeks ago. While in the emergency room she was noted to have left-sided facial droop as well as right lower extremity weakness which gradually resolved. During my encounter she reports resolution to facial droop although continues to have right lower extremity weakness. Patient was recently admitted to [facility] in December 2014 at which time she was treated for transient ischemic attack. She recently underwent Cardiolite stress test in December as well which did not show evidence for ischemia or old defect. Because of history of pacemaker implant, MRI cannot be done.         Prior Functioning     Home Living Family/patient expects to be discharged to:: Assisted living Home Equipment: Walker - 4 wheels Prior Function Level of Independence: Needs assistance Gait / Transfers Assistance Needed: per daughter, patient had assist for bathing and supervision for mobility Communication Communication: No difficulties Dominant Hand: Right          Vision/Perception Vision - History Baseline Vision: Wears glasses all the time (has prism glasses-double and blurry vision at times) Vision - Assessment Vision Assessment: Vision not tested   Cognition  Cognition Arousal/Alertness: Awake/alert Behavior During Therapy: WFL for tasks assessed/performed Overall Cognitive Status: No family/caregiver present to determine baseline cognitive functioning Area of Impairment: Safety/judgement Safety/Judgement: Decreased awareness of safety    Extremity/Trunk Assessment Upper Extremity Assessment Upper Extremity Assessment: RUE deficits/detail;Generalized weakness RUE Deficits / Details: weak wrist Lower Extremity Assessment Lower Extremity Assessment: Defer to PT evaluation     Mobility Bed Mobility Overal bed mobility: Modified Independent Transfers Overall transfer level: Needs assistance Equipment used: 4-wheeled walker Transfers: Sit to/from Stand Sit to Stand: Min guard General transfer comment: cues for technique and hand placement.     Exercise     Balance     End of Session OT - End of Session Equipment Utilized During Treatment: Gait belt;Other (comment) (rollator) Activity Tolerance: Patient limited by fatigue Patient left: in bed;with call bell/phone within reach;with bed alarm set Nurse Communication: Patient requests pain meds  GO     Earlie Raveling OTR/L [phone] 11/05/2013, 6:02 PM

## 2013-11-05 NOTE — Progress Notes (Signed)
Physical Therapy Treatment Patient Details Name: Tonya Hammond MRN: [id number] DOB: 07/26/1927 Today's Date: 11/05/2013 Time: 1041-1100 PT Time Calculation (min): 19 min  PT Assessment / Plan / Recommendation  History of Present Illness Tonya Hammond is a 80 y.o. female with a past medical history of pacemaker implant, sick sinus syndrome, hypertension, Lupus, presenting in this evening to the emergency department as a transfer from an assisted-living facility. she reports noticing a rash over her face this morning which she initially attributed to butterfly rash from lupus. Over the course of the morning her rash spread down to upper part of her trunk and to bilateral extremities. She was administered Benadryl by nursing home staff at approximately 1:30 PM. She states that she was started on Plavix therapy approximately 2 weeks ago. While in the emergency room she was noted to have left-sided facial droop as well as right lower extremity weakness which gradually resolved. During my encounter she reports resolution to facial droop although continues to have right lower extremity weakness. Patient was recently admitted to [facility] in December 2014 at which time she was treated for transient ischemic attack. She recently underwent Cardiolite stress test in December as well which did not show evidence for ischemia or old defect. Because of history of pacemaker implant, MRI cannot be done.     PT Comments   Patient continues to demonstrate improvements in ambulation and mobility. Ambulated increased distance today with 2 standing rest breaks, improved activity tolerance today, good recall of pursed lip breathing.  Follow Up Recommendations  Home health PT (Return to ALF with HHPT)           Equipment Recommendations  None recommended by PT       Frequency Min 3X/week   Progress towards PT Goals Progress towards PT goals: Progressing toward goals  Plan Current plan remains  appropriate    Precautions / Restrictions Precautions Precautions: Fall Restrictions Weight Bearing Restrictions: No   Pertinent Vitals/Pain No pain today    Mobility  Transfers Overall transfer level: Needs assistance Equipment used: 4-wheeled walker Transfers: Sit to/from Stand Sit to Stand: Supervision General transfer comment: VCs for safety and hand placement, patient abandons rollator during mobility Ambulation/Gait Ambulation/Gait assistance: Supervision Ambulation Distance (Feet): 240 Feet Assistive device: 4-wheeled walker Gait Pattern/deviations: Step-through pattern;Decreased stride length;Trunk flexed;Narrow base of support Gait velocity: decreased Gait velocity interpretation: <1.8 ft/sec, indicative of risk for recurrent falls General Gait Details: 2 standing rest breaks, improved activity tolerance today, good recall of pursed lip breathing.        PT Diagnosis: Abnormality of gait;Generalized weakness  PT Problem List: Decreased strength;Decreased activity tolerance;Decreased mobility;Decreased knowledge of use of DME;Decreased safety awareness PT Treatment Interventions: DME instruction;Gait training;Stair training;Functional mobility training;Therapeutic activities;Therapeutic exercise;Balance training;Patient/family education   PT Goals (current goals can now be found in the care plan section) Acute Rehab PT Goals Patient Stated Goal: To go home PT Goal Formulation: With patient/family Time For Goal Achievement: 11/18/13 Potential to Achieve Goals: Good  Visit Information  Last PT Received On: 11/05/13 Assistance Needed: +1 Reason Eval/Treat Not Completed: Patient at procedure or test/unavailable History of Present Illness: Tonya Hammond is a 80 y.o. female with a past medical history of pacemaker implant, sick sinus syndrome, hypertension, Lupus, presenting in this evening to the emergency department as a transfer from an assisted-living facility. she  reports noticing a rash over her face this morning which she initially attributed to butterfly rash from lupus. Over the course of the morning her  rash spread down to upper part of her trunk and to bilateral extremities. She was administered Benadryl by nursing home staff at approximately 1:30 PM. She states that she was started on Plavix therapy approximately 2 weeks ago. While in the emergency room she was noted to have left-sided facial droop as well as right lower extremity weakness which gradually resolved. During my encounter she reports resolution to facial droop although continues to have right lower extremity weakness. Patient was [facility] in December 2014 at which time she was treated for transient ischemic attack. She recently underwent Cardiolite stress test in December as well which did not show evidence for ischemia or old defect. Because of history of pacemaker implant, MRI cannot be done.      Subjective Data  Patient Stated Goal: To go home   Cognition  Cognition Arousal/Alertness: Awake/alert Behavior During Therapy: WFL for tasks assessed/performed Overall Cognitive Status: Impaired/Different from baseline Area of Impairment: Attention;Safety/judgement Current Attention Level: Selective Safety/Judgement: Decreased awareness of safety General Comments: Less distracted today    Balance   Improving, still requires VCs for safety with use of rollator  End of Session PT - End of Session Equipment Utilized During Treatment: Gait belt Activity Tolerance: Patient tolerated treatment well Patient left: in chair;with call bell/phone within reach;with family/visitor present Nurse Communication: Mobility status   GP     Fabio AsaWerner, Tonya Hammond 11/05/2013, 11:09 AM Charlotte Crumbevon Tonya Hammond, PT DPT  [phone]

## 2013-11-05 NOTE — Progress Notes (Signed)
[facility] PROGRESS NOTE  Tonya SantosClara Hammond UUV:[id number] DOB: [birth year] DOA: 11/02/2013 PCP: Joycelyn RuaMEYERS, STEPHEN, MD  Assessment/Plan: #1 right-sided weakness Clinical improvement. Patient and daughter stated that when she had the right-sided weakness in the emergency room daughter states that patient had a stiffening of her right upper extremity right lower extremity with some associated confusion after that. Concern also is for possible seizures versus stroke. Repeat CT of the head was negative. Patient unable to get an MRI of the head secondary to pacemaker.?? CT angio. 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 Patient had bleeding in eye to eliquis. Discontinued Aggrenox. Patient was recently placed on Plavix and had presented to the ED secondary to possible allergic reaction causing a rash. EEG pending to rule out seizures. Carotid Doppler with no significant ICA stenosis.  2-D echo with no source of emboli. Patient initially started on Dilantin yesterday which has been discontinued by neurology. Per neurology no further workup needed at this time.   #2 rash Possible drug reaction to Plavix. Rash has improved with Benadryl and discontinuation of Plavix. Continue prednisone. Follow.  #3 hypertension Stable. Continue imdur, Lopressor, lasix.  #4 hypothyroidism TSH within normal limits at 0.709. Continue Synthroid.  #5  sinus syndrome status post pacemaker/coronary artery disease Stable. Continue Lopressor, Lasix, imdur.  #6 history of lupus Continue steroid therapy. Outpatient followup.  #7 prophylaxis SCDs for DVT prophylaxis.  Code Status: DO NOT RESUSCITATE Family Communication: Updated patient and daughter at bedside. Disposition Plan: Back to assisted living facility when medically stable.   Consultants:  Neurology: Dr. Cyril Mourningamillo 11/03/2013  Procedures:  CT head 11/02/2013, 11/04/2013  Chest x-ray 11/02/2013  2-D echo 11/03/2013  EEG  11/05/13  Carotid Dopplers 11/04/2013  Antibiotics:  None  HPI/Subjective: Patient states right-sided weakness has improved. Patient states rash has improved.   Objective: Filed Vitals:   11/05/13 1633  BP: 136/51  Pulse: 62  Temp: 97.8 F (36.6 C)  Resp: 18    Intake/Output Summary (Last 24 hours) at 11/05/13 2014 Last data filed at 11/05/13 1357  Gross per 24 hour  Intake    480 ml  Output      0 ml  Net    480 ml   Filed Weights   11/02/13 2230  Weight: 60.691 kg (133 lb 12.8 oz)    Exam:   General:  NAD. Rash improved.  Cardiovascular: RRR  Respiratory: CTA B.  Abdomen: Soft, nontender, nondistended, positive bowel sounds  Musculoskeletal: No clubbing cyanosis or edema.  Data Reviewed: Basic Metabolic Panel:  Recent Labs Lab 11/02/13 1700 11/04/13 0530 11/05/13 0620  NA 141 143 143  K 4.1 3.6* 4.1  CL 103 105 106  CO2 26 27 24   GLUCOSE 108* 88 85  BUN 14 15 16   CREATININE 0.78 0.78 0.69  CALCIUM 9.2 8.2* 8.4   Liver Function Tests:  Recent Labs Lab 11/02/13 1700 11/04/13 0530  AST 17 12  ALT 10 8  ALKPHOS 48 37*  BILITOT 0.3 <0.2*  PROT 6.1 5.2*  ALBUMIN 3.3* 2.7*   No results found for this basename: LIPASE, AMYLASE,  in the last 168 hours No results found for this basename: AMMONIA,  in the last 168 hours CBC:  Recent Labs Lab 11/02/13 1700 11/04/13 0530 11/05/13 0620  WBC 5.2 6.6 7.3  NEUTROABS  --  3.8  --   HGB 12.4 10.6* 11.6*  HCT 37.3 30.4* 34.5*  MCV 92.3 91.6 92.7  PLT 160 170 202  Cardiac Enzymes: No results found for this basename: CKTOTAL, CKMB, CKMBINDEX, TROPONINI,  in the last 168 hours BNP (last 3 results) No results found for this basename: PROBNP,  in the last 8760 hours CBG:  Recent Labs Lab 11/04/13 1344 11/04/13 1716 11/04/13 2133 11/05/13 0640 11/05/13 1640  GLUCAP 129* 128* 110* 80 135*    Recent Results (from the past 240 hour(s))  MRSA PCR SCREENING     Status: Abnormal    Collection Time    11/03/13  3:01 AM      Result Value Range Status   MRSA by PCR POSITIVE (*) NEGATIVE Final   Comment:            The GeneXpert MRSA Assay (FDA     approved for NASAL specimens     only), is one component of a     comprehensive MRSA colonization     surveillance program. It is not     intended to diagnose MRSA     infection nor to guide or     monitor treatment for     MRSA infections.     RESULT CALLED TO, READ BACK BY AND VERIFIED WITH:     C SIMON AT 0719 ON 01.31.2015 BY NBROOKS     Studies: Ct Head Wo Contrast  11/04/2013   CLINICAL DATA:  Followup stroke. Headache in the region of the left eye.  EXAM: CT HEAD WITHOUT CONTRAST  TECHNIQUE: Contiguous axial images were obtained from the base of the skull through the vertex without intravenous contrast.  COMPARISON:  11/02/2013  FINDINGS: Ventricles are normal in configuration. There is ventricular and sulcal enlargement reflecting age related volume loss. No hydrocephalus.  There are no parenchymal masses or mass effect. Periventricular white matter hypoattenuation is noted consistent with chronic microvascular ischemic change there is no evidence of a cortical infarct.  There are no extra-axial masses or abnormal fluid collections.  There is no intracranial hemorrhage.  The visualized sinuses and mastoid air cells are clear.  IMPRESSION: 1. No acute intracranial abnormalities. 2. No change from the previous exam.   Electronically Signed   By: Amie Portland M.D.   On: 11/04/2013 07:18    Scheduled Meds: . [START ON 11/06/2013] Chlorhexidine Gluconate Cloth  6 each Topical Q0600  . docusate sodium  200 mg Oral QHS  . furosemide  20 mg Oral Daily  . isosorbide mononitrate  30 mg Oral QHS  . levothyroxine  25 mcg Oral QAC breakfast  . [START ON 11/06/2013] metoprolol succinate  50 mg Oral Daily  . morphine  15 mg Oral BID  . mupirocin ointment  1 application Nasal BID  . pantoprazole  40 mg Oral Daily  . predniSONE  40  mg Oral Q breakfast   Continuous Infusions:   Principal Problem:   Right sided weakness Active Problems:   Allergic reaction   HTN (hypertension)   Pacemaker   Lupus   CVA (cerebral vascular accident)   Rash and nonspecific skin eruption    Time spent: 40 minutes    Azalynn Maxim M.D. [facility] Pager [phone]. If 7PM-7AM, please contact night-coverage at [email or web address], password [facility] 11/05/2013, 8:14 PM  LOS: 3 days

## 2013-11-05 NOTE — Progress Notes (Signed)
EEG Completed; Results Pending  

## 2013-11-05 NOTE — Progress Notes (Addendum)
Stroke Team Progress Note  HISTORY Tonya Hammond is an 80 y.o. female, right handed, with a past medical history significant for HTN, sick sinus syndrome, syncope, s/p pacemaker placement, hypothyroidism, Lupus, scoliosis, transferred to [facility] 11/02/2013 for further evaluation of probable stroke.  [name] is a resident at an assisted living facility and was initially evaluated 11/02/2013 at [facility] ED after noticing a rash in her face that was thought to be secondary to an allergic reaction to plavix. She said that while in the ED she experienced " a painful sensation in my right arm that was rapidly followed by pain-numbness-weakness of my right leg, mainly the right foot and I couldn't raise it".Stated that she was surprised by the severity of the weakness when she was examined by the ED physician that afternoon. Of note, there is conflicting report about whether or not she had a left face droop that resolved in the ED.   She said that she woke up on the morning of 11/02/2013 feeling " miserable", and recalled that " right after breakfast, perhaps around 830 am, I was confused and was having difficulty talking, things were coming out of my mouth differently". Denied associated HA, vertigo, double vision, chest pain, or palpitations. CT brain at [facility] showed no acute abnormality. TTE unremarkable. Total cholesterol 138, triglycerides 66, HDL 41, LDL 84. Transferred to [facility] due to concern for an acute stroke.  LAST KNOWN WELL: UNCERTAIN, PERHAPS 830 am 11/02/13  TPA not given due to unclear time of onset, late presentation.  SUBJECTIVE Daughter at bedside.   OBJECTIVE Most recent Vital Signs: Filed Vitals:   11/04/13 1717 11/04/13 2111 11/05/13 0105 11/05/13 0642  BP: 135/61 151/70 160/65 142/62  Pulse: 66 18 63   Temp: 98.2 F (36.8 C) 97.8 F (36.6 C) 97.5 F (36.4 C) 97.5 F (36.4 C)  TempSrc: Oral Oral Oral Oral  Resp: 18  18 18   Height:      Weight:      SpO2: 97% 99% 95% 95%   CBG  (last 3)   Recent Labs  11/04/13 1716 11/04/13 2133 11/05/13 0640  GLUCAP 128* 110* 80    IV Fluid Intake:     MEDICATIONS  . [START ON 11/06/2013] Chlorhexidine Gluconate Cloth  6 each Topical Q0600  . docusate sodium  200 mg Oral QHS  . furosemide  20 mg Oral Daily  . isosorbide mononitrate  30 mg Oral QHS  . levothyroxine  25 mcg Oral QAC breakfast  . metoprolol succinate  50 mg Oral Daily  . morphine  15 mg Oral BID  . mupirocin ointment  1 application Nasal BID  . pantoprazole  40 mg Oral Daily  . predniSONE  40 mg Oral Q breakfast   PRN:  acetaminophen, diphenhydrAMINE, LORazepam, morphine, nitroGLYCERIN, polyvinyl alcohol, sodium chloride  Diet:  Cardiac thin liquids Activity:  Bathroom privileges with assistance. DVT Prophylaxis:  Lovenox  CLINICALLY SIGNIFICANT STUDIES Basic Metabolic Panel:   Recent Labs Lab 11/04/13 0530 11/05/13 0620  NA 143 143  K 3.6* 4.1  CL 105 106  CO2 27 24  GLUCOSE 88 85  BUN 15 16  CREATININE 0.78 0.69  CALCIUM 8.2* 8.4   Liver Function Tests:   Recent Labs Lab 11/02/13 1700 11/04/13 0530  AST 17 12  ALT 10 8  ALKPHOS 48 37*  BILITOT 0.3 <0.2*  PROT 6.1 5.2*  ALBUMIN 3.3* 2.7*   CBC:   Recent Labs Lab 11/02/13 1700 11/04/13 0530 11/05/13 0620  WBC  5.2 6.6 7.3  NEUTROABS  --  3.8  --   HGB 12.4 10.6* 11.6*  HCT 37.3 30.4* 34.5*  MCV 92.3 91.6 92.7  PLT 160 170 202   Coagulation:   Recent Labs Lab 11/02/13 1700  LABPROT 13.5  INR 1.05   Cardiac Enzymes: No results found for this basename: CKTOTAL, CKMB, CKMBINDEX, TROPONINI,  in the last 168 hours Urinalysis:   Recent Labs Lab 11/02/13 1632  COLORURINE YELLOW  LABSPEC 1.006  PHURINE 7.0  GLUCOSEU NEGATIVE  HGBUR NEGATIVE  BILIRUBINUR NEGATIVE  KETONESUR NEGATIVE  PROTEINUR NEGATIVE  UROBILINOGEN 0.2  NITRITE NEGATIVE  LEUKOCYTESUR NEGATIVE   Lipid Panel    Component Value Date/Time   CHOL 138 11/03/2013 0550   TRIG 66 11/03/2013 0550    HDL 41 11/03/2013 0550   CHOLHDL 3.4 11/03/2013 0550   VLDL 13 11/03/2013 0550   LDLCALC 84 11/03/2013 0550   HgbA1C  Lab Results  Component Value Date   HGBA1C 5.6 11/03/2013    Urine Drug Screen:   No results found for this basename: labopia,  cocainscrnur,  labbenz,  amphetmu,  thcu,  labbarb    Alcohol Level: No results found for this basename: ETH,  in the last 168 hours  Dg Chest 2 View 11/02/2013    Small bilateral pleural effusions and pulmonary vascular congestion.     Ct Head Wo Contrast 11/04/2013    1. No acute intracranial abnormalities. 2. No change from the previous exam.     Ct Head Wo Contrast 11/02/2013    1. No acute intracranial abnormalities. 2. No significant abnormality of either globe or orbit. 3. Mild atrophy and mild chronic microvascular ischemic change.    MRI of the brain  permanent pacemaker  MRA of the brain  permanent pacemaker  2D Echocardiogram  ejection fraction 55-60%. No cardiac source of emboli identified.  Carotid Doppler  No evidence of hemodynamically significant internal carotid artery stenosis. Vertebral artery flow is antegrade.   EEG    EKG sinus rhythm rate 71 beats per minute  Therapy Recommendations home health PT  Physical Exam   Neurologic Examination:  Mental Status:  Alert, oriented, thought content appropriate. Speech fluent without evidence of aphasia. Able to follow 3 step commands without difficulty.  Cranial Nerves:  II: Discs flat bilaterally; Visual fields grossly normal, pupils equal, round, reactive to light and accommodation  III,IV, VI: ptosis not present, extra-ocular motions intact bilaterally  V,VII: smile symmetric, facial light touch sensation normal bilaterally  VIII: hearing normal bilaterally  IX,X: gag reflex present  XI: bilateral shoulder shrug  XII: midline tongue extension without atrophy or fasciculations  Motor:  Significant for right leg weakness.  Tone and bulk:normal tone throughout   Sensory: Pinprick and light touch intact throughout, bilaterally  Deep Tendon Reflexes:  Right: Upper Extremity Left: Upper extremity  biceps (C-5 to C-6) 2/4 biceps (C-5 to C-6) 2/4  tricep (C7) 2/4 triceps (C7) 2/4  Brachioradialis (C6) 2/4 Brachioradialis (C6) 2/4  Lower Extremity Lower Extremity  quadriceps (L-2 to L-4) 2/4 quadriceps (L-2 to L-4) 2/4  Achilles (S1) 2/4 Achilles (S1) 2/4  Plantars:  Right: downgoing Left: downgoing  Cerebellar:  normal finger-to-nose, normal heel-to-shin test  Gait:  Not tested.   ASSESSMENT [name] is a 80 y.o. female presenting with right hemiparesis. TPA was not administered as the patient was outside the window for treatment. CTs of the head revealed no acute abnormalities. Cannot have MRI secondary to a permanent pacemaker.  History is not consistent with acute stroke - doubt stroke, TIA or seizure. Possible pain/heaviness on right leg due to allergic reaction s/p 100mg  solumedrol & benadryl, right foot rash. On clopidogrel 75 mg orally every day prior to admission. Now on no antiplatelet for secondary stroke prevention. Patient with resultant right lower extremity weakness. Work up completed.   Permanent pacemaker for sick sinus syndrome  Hypertension history  Lupus  TIA December 2014 - Plavix therapy initiated at that time. Tolerated at that time.  Anemia - hemoglobin 10.6 hematocrit 30.4  Small bilateral pleural effusions and pulmonary vascular congestion by chest x-ray  Recent report of allergy to plavix when it was started along with protonix with resultant rash and angioedema (of not, pt has been on plavix in the past without side effect)  Also pt with reported allergy to aspirin   Hospital day # 3  TREATMENT/PLAN  Do not recommend Brilinta (ticagrelor) 90 mg BID; there is no evidence to support this treatment  No antiplatelets at discharge due to aspirin and plavix allergies  [facility]u  F/u EEG  results.   [facility]  No neurologic followup indicated.  Nothing further to add from the stroke standpoint. Will sign-off. Please call for questions.  Annie MainSHARON BIBY, MSN, RN, ANVP-BC, ANP-BC, Lawernce IonGNP-BC [facility] Pager: [phone] 11/05/2013 1:06 PM  I have personally obtained a history, examined the patient, evaluated imaging results, and formulated the assessment and plan of care. I agree with the above. Delia HeadyPramod Arshiya Jakes, MD

## 2013-11-05 NOTE — Progress Notes (Signed)
CSW [facility] FL[facility] fax[facility] to [phone]. CSW has done this.   Tonya LabradorJulie Bobbi Yount, MSW, [facility]SWA Clinical Social Worker [phone]

## 2013-11-06 LAB — GLUCOSE, CAPILLARY
GLUCOSE-CAPILLARY: 92 mg/dL (ref 70–99)
Glucose-Capillary: 84 mg/dL (ref 70–99)
Glucose-Capillary: 110 mg/dL — ABNORMAL HIGH (ref 70–99)
Glucose-Capillary: 132 mg/dL — ABNORMAL HIGH (ref 70–99)

## 2013-11-06 MED ORDER — SENNOSIDES-DOCUSATE SODIUM 8.6-50 MG PO TABS
2.0000 | ORAL_TABLET | Freq: Once | ORAL | Status: AC
  Administered 2013-11-06: 1 via ORAL
  Filled 2013-11-06: qty 1

## 2013-11-06 MED ORDER — SODIUM CHLORIDE 0.9 % IV SOLN
INTRAVENOUS | Status: AC
  Administered 2013-11-06: 10:00:00 via INTRAVENOUS

## 2013-11-06 NOTE — Progress Notes (Signed)
[facility]; [facility] and [facility] will arrange [facility] at their facility after discharge; Abelino DerrickB Nicolaos Mitrano RN,BSN,MHA [phone]

## 2013-11-06 NOTE — Progress Notes (Signed)
[facility] PROGRESS NOTE  Tonya SantosClara Kienast ZOX:[id number] DOB: 11/28/1926 DOA: 11/02/2013 PCP: Joycelyn RuaMEYERS, STEPHEN, MD  Assessment/Plan: #1 right-sided weakness Clinical improvement. Patient and daughter stated that when she had the right-sided weakness in the emergency room daughter states that patient had a stiffening of her right upper extremity right lower extremity with some associated confusion after that. Concern also is for possible seizures versus stroke. Repeat CT of the head was negative. Patient unable to get an MRI of the head secondary to pacemaker.?? CT angio. 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 Patient had bleeding in eye to eliquis. Discontinued Aggrenox. Patient was recently placed on Plavix and had presented to the ED secondary to possible allergic reaction causing a rash. EEG negative. Carotid Doppler with no significant ICA stenosis.  2-D echo with no source of emboli. Patient seen by neurology and awaiting records from [place]. Per neurology no further workup needed at this time.   #2 rash Possible drug reaction to Plavix. Rash has improved with Benadryl and discontinuation of Plavix. Continue prednisone. Follow.  #3 hypertension Stable. Continue imdur, Lopressor, lasix.  #4 hypothyroidism TSH within normal limits at 0.709. Continue Synthroid.  #5  sinus syndrome status post pacemaker/coronary artery disease Stable. Continue Lopressor, Lasix, imdur.  #6 history of lupus Continue steroid therapy. Outpatient followup.  #7 prophylaxis SCDs for DVT prophylaxis.  Code Status: DO NOT RESUSCITATE Family Communication: Updated patient and daughter at bedside. Disposition Plan: Back to assisted living facility when medically stable, hopefully tomorrow if ok with neurology.   Consultants:  Neurology: Dr. Cyril Mourningamillo 11/03/2013  Procedures:  CT head 11/02/2013, 11/04/2013  Chest x-ray 11/02/2013  2-D echo 11/03/2013  EEG  11/05/13  Carotid Dopplers 11/04/2013  Antibiotics:  None  HPI/Subjective: Patient states right-sided weakness has improved. Patient states rash has improved.   Objective: Filed Vitals:   11/06/13 0941  BP: 122/50  Pulse: 68  Temp: 97.8 F (36.6 C)  Resp: 18    Intake/Output Summary (Last 24 hours) at 11/06/13 1415 Last data filed at 11/06/13 1326  Gross per 24 hour  Intake   1080 ml  Output      0 ml  Net   1080 ml   Filed Weights   11/02/13 2230  Weight: 60.691 kg (133 lb 12.8 oz)    Exam:   General:  NAD. Rash improved.  Cardiovascular: RRR  Respiratory: CTAB.  Abdomen: Soft, nontender, nondistended, positive bowel sounds  Musculoskeletal: No clubbing cyanosis or edema.  Data Reviewed: Basic Metabolic Panel:  Recent Labs Lab 11/02/13 1700 11/04/13 0530 11/05/13 0620  NA 141 143 143  K 4.1 3.6* 4.1  CL 103 105 106  CO2 26 27 24   GLUCOSE 108* 88 85  BUN 14 15 16   CREATININE 0.78 0.78 0.69  CALCIUM 9.2 8.2* 8.4   Liver Function Tests:  Recent Labs Lab 11/02/13 1700 11/04/13 0530  AST 17 12  ALT 10 8  ALKPHOS 48 37*  BILITOT 0.3 <0.2*  PROT 6.1 5.2*  ALBUMIN 3.3* 2.7*   No results found for this basename: LIPASE, AMYLASE,  in the last 168 hours No results found for this basename: AMMONIA,  in the last 168 hours CBC:  Recent Labs Lab 11/02/13 1700 11/04/13 0530 11/05/13 0620  WBC 5.2 6.6 7.3  NEUTROABS  --  3.8  --   HGB 12.4 10.6* 11.6*  HCT 37.3 30.4* 34.5*  MCV 92.3 91.6 92.7  PLT 160 170 202   Cardiac Enzymes: No results  found for this basename: CKTOTAL, CKMB, CKMBINDEX, TROPONINI,  in the last 168 hours BNP (last 3 results) No results found for this basename: PROBNP,  in the last 8760 hours CBG:  Recent Labs Lab 11/05/13 0640 11/05/13 1640 11/05/13 2229 11/06/13 0701 11/06/13 1138  GLUCAP 80 135* 147* 84 92    Recent Results (from the past 240 hour(s))  MRSA PCR SCREENING     Status: Abnormal   Collection  Time    11/03/13  3:01 AM      Result Value Range Status   MRSA by PCR POSITIVE (*) NEGATIVE Final   Comment:            The GeneXpert MRSA Assay (FDA     approved for NASAL specimens     only), is one component of a     comprehensive MRSA colonization     surveillance program. It is not     intended to diagnose MRSA     infection nor to guide or     monitor treatment for     MRSA infections.     RESULT CALLED TO, READ BACK BY AND VERIFIED WITH:     C SIMON AT 0719 ON 01.31.2015 BY NBROOKS     Studies: No results found.  Scheduled Meds: . Chlorhexidine Gluconate Cloth  6 each Topical Q0600  . docusate sodium  200 mg Oral QHS  . isosorbide mononitrate  30 mg Oral QHS  . levothyroxine  25 mcg Oral QAC breakfast  . metoprolol succinate  50 mg Oral Daily  . morphine  15 mg Oral BID  . mupirocin ointment  1 application Nasal BID  . pantoprazole  40 mg Oral Daily  . predniSONE  40 mg Oral Q breakfast   Continuous Infusions: . sodium chloride 75 mL/hr at 11/06/13 1022    Principal Problem:   Right sided weakness Active Problems:   Allergic reaction   HTN (hypertension)   Pacemaker   Lupus   CVA (cerebral vascular accident)   Rash and nonspecific skin eruption    Time spent: 40 minutes    Adia Crammer M.D. [facility] Pager [phone]. If 7PM-7AM, please contact night-coverage at [email or web address], password [facility] 11/06/2013, 2:15 PM  LOS: 4 days

## 2013-11-06 NOTE — Progress Notes (Signed)
Physical Therapy Treatment Patient Details Name: Tonya Hammond MRN: [id number] DOB: 1927/02/28 Today's Date: 11/06/2013 Time: [id number] PT Time Calculation (min): 23 min  PT Assessment / Plan / Recommendation  History of Present Illness Tonya Hammond is a 80 y.o. female with a past medical history of pacemaker implant, sick sinus syndrome, hypertension, Lupus, presenting in this evening to the emergency department as a transfer from an assisted-living facility. she reports noticing a rash over her face this morning which she initially attributed to butterfly rash from lupus. Over the course of the morning her rash spread down to upper part of her trunk and to bilateral extremities. She was administered Benadryl by nursing home staff at approximately 1:30 PM. She states that she was started on Plavix therapy approximately 2 weeks ago. While in the emergency room she was noted to have left-sided facial droop as well as right lower extremity weakness which gradually resolved. During my encounter she reports resolution to facial droop although continues to have right lower extremity weakness. Patient was recently [facility] in December 2014 at which time she was treated for transient ischemic attack. She recently underwent Cardiolite stress test in December as well which did not show evidence for ischemia or old defect. Because of history of pacemaker implant, MRI cannot be done.     PT Comments   Patient continues to demonstrate improvements in mobility and activity tolerance. Will continue to progress as tolerated.   Follow Up Recommendations  Home health PT (Return to ALF with HHPT)           Equipment Recommendations  None recommended by PT       Frequency Min 3X/week   Progress towards PT Goals Progress towards PT goals: Progressing toward goals  Plan Current plan remains appropriate    Precautions / Restrictions Precautions Precautions: Fall   Pertinent  Vitals/Pain No pain at this time    Mobility  Bed Mobility Overal bed mobility: Modified Independent Transfers Overall transfer level: Needs assistance Equipment used: 4-wheeled walker Transfers: Sit to/from Stand Sit to Stand: Min guard General transfer comment: Continues to require some VCs for safety Ambulation/Gait Ambulation/Gait assistance: Supervision Ambulation Distance (Feet): 460 Feet Assistive device: 4-wheeled walker Gait Pattern/deviations: Step-through pattern;Decreased stride length;Trunk flexed;Narrow base of support Gait velocity: decreased Gait velocity interpretation: <1.8 ft/sec, indicative of risk for recurrent falls General Gait Details: 2 standing rest breaks, improved activity tolerance today, good recall of pursed lip breathing.      PT Goals (current goals can now be found in the care plan section) Acute Rehab PT Goals Patient Stated Goal: To go home PT Goal Formulation: With patient/family Time For Goal Achievement: 11/18/13 Potential to Achieve Goals: Good  Visit Information  Last PT Received On: 11/06/13 Assistance Needed: +1 History of Present Illness: Tonya Hammond is a 80 y.o. female with a past medical history of pacemaker implant, sick sinus syndrome, hypertension, Lupus, presenting in this evening to the emergency department as a transfer from an assisted-living facility. she reports noticing a rash over her face this morning which she initially attributed to butterfly rash from lupus. Over the course of the morning her rash spread down to upper part of her trunk and to bilateral extremities. She was administered Benadryl by nursing home staff at approximately 1:30 PM. She states that she was started on Plavix therapy approximately 2 weeks ago. While in the emergency room she was noted to have left-sided facial droop as well as right lower extremity weakness which  gradually resolved. During my encounter she reports resolution to facial droop  although continues to have right lower extremity weakness. Patient was recently admitted to [facility] in December 2014 at which time she was treated for transient ischemic attack. She recently underwent Cardiolite stress test in December as well which did not show evidence for ischemia or old defect. Because of history of pacemaker implant, MRI cannot be done.      Subjective Data  Patient Stated Goal: To go home   Cognition  Cognition Arousal/Alertness: Awake/alert Behavior During Therapy: WFL for tasks assessed/performed Overall Cognitive Status: Impaired/Different from baseline Area of Impairment: Attention;Safety/judgement Current Attention Level: Selective Safety/Judgement: Decreased awareness of safety    Balance  Balance Overall balance assessment: History of Falls  End of Session PT - End of Session Equipment Utilized During Treatment: Gait belt Activity Tolerance: Patient tolerated treatment well Patient left: in bed;with call bell/phone within reach;with bed alarm set Nurse Communication: Mobility status;Other (comment) (contact isolation indicated, allergies to meds, DNR )   GP     Fabio Asa 11/06/2013, 4:26 PM Charlotte Crumb, PT DPT  [phone]

## 2013-11-06 NOTE — Progress Notes (Addendum)
[facility] pt is returning to facility today and ask what information facility needs prior to discharge. CSW spoke with admissions, who asked CSW to re-send the FL2.  Addendum: CSW followed up with facility, who is reviewing FL2 now and will call CSW back.  Addendum: [facility] needs to set up [facility]. CSW referred RNCM to call facility. CSW paged MD, requesting discharge summary before 4:00pm for [facility] to [facility].  Maryclare LabradorJulie Bernis Schreur, MSW, [facility]CSWA Clinical Social Worker [phone]

## 2013-11-06 NOTE — Progress Notes (Signed)
OT Cancellation Note  Patient Details Name: Tonya Hammond MRN: [id number] DOB: 1926/12/29   Cancelled Treatment:     Pt & family asked that OT attempt treatment session later this afternoon. Will re-attempt as able.  Roselie AwkwardBarnhill, Jamye Balicki Beth Dixon 11/06/2013, 1:41 PM

## 2013-11-06 NOTE — Procedures (Signed)
EEG report.  Brief clinical history:Tonya Hammond is a 80 y.o. female presenting with right hemiparesis.     Technique: this is a 17 channel routine scalp EEG performed at the bedside with bipolar and monopolar montages arranged in accordance to the international 10/20 system of electrode placement. One channel was dedicated to EKG recording.  The study was performed during wakefulness and drowsiness. No activating procedures performed.  Description:In the wakeful state, the best background consisted of a low amplitude, posterior dominant, well sustained, symmetric and reactive 10 Hz rhythm. Drowsiness demonstrated dropout of the alpha rhythm. Stage 2 sleep was not achieved. No focal or generalized epileptiform discharges noted.  Rare, intermittent left temporal theta slowing seen. EKG showed sinus rhythm.  Impression: this is a normal awake and drowsy EEG. The presence of rare intermittent left temporal theta slowing is a  normal phenomenon in this age population. Please, be aware that a normal EEG does not exclude the possibility of epilepsy.  Clinical correlation is advised.  Wyatt Portelasvaldo Ace Bergfeld, MD

## 2013-11-06 NOTE — Progress Notes (Signed)
Called [facility] about records received. Did not receive any notes from Dr. Kemper Durielarke. Although his name is listed as Research scientist (medical)consultant on discharge summary from hospital admission 12/28-12/30/2014. Per Medical Records there are no notes from Dr. Kemper Durielarke in the patient's medical records; specifically 12/28-12/30. Dr Janee Mornhompson notified. Placed records that were sent with shadow chart.

## 2013-11-06 NOTE — Progress Notes (Signed)
Occupational Therapy Treatment Patient Details Name: Tonya Hammond MRN: [id number] DOB: 02/17/1927 Today's Date: 11/06/2013 Time: 1400-1430 OT Time Calculation (min): 30 min  OT Assessment / Plan / Recommendation  History of present illness Tonya Hammond is a 80 y.o. female with a past medical history of pacemaker implant, sick sinus syndrome, hypertension, Lupus, presenting in this evening to the emergency department as a transfer from an assisted-living facility. she reports noticing a rash over her face this morning which she initially attributed to butterfly rash from lupus. Over the course of the morning her rash spread down to upper part of her trunk and to bilateral extremities. She was administered Benadryl by nursing home staff at approximately 1:30 PM. She states that she was started on Plavix therapy approximately 2 weeks ago. While in the emergency room she was noted to have left-sided facial droop as well as right lower extremity weakness which gradually resolved. During my encounter she reports resolution to facial droop although continues to have right lower extremity weakness. Patient was recently [facility] in December 2014 at which time she was treated for transient ischemic attack. She recently underwent Cardiolite stress test in December as well which did not show evidence for ischemia or old defect. Because of history of pacemaker implant, MRI cannot be done.     OT comments  Agreeable to participation in skilled ot.  Moving well but cont. To require cues for walker safety and hand placement during functional transfers  Follow Up Recommendations  No OT follow up;Supervision/Assistance - 24 hour          Equipment Recommendations  None recommended by OT        Frequency Min 2X/week   Progress towards OT Goals Progress towards OT goals: Progressing toward goals  Plan Discharge plan remains appropriate    Precautions / Restrictions  Precautions Precautions: Fall   Pertinent Vitals/Pain No c/o pain    ADL  Grooming: Performed;Wash/dry hands;Min guard Where Assessed - Grooming: Unsupported standing Lower Body Dressing: Performed;Minimal assistance Where Assessed - Lower Body Dressing: Supported sitting Toilet Transfer: Performed;Min guard StatisticianToilet Transfer Method: Sit to Barist[place]and Toilet Transfer Equipment: Raised toilet seat with arms (or 3-in-1 over toilet) Toileting - Clothing Manipulation and Hygiene: Performed;Min guard Where Assessed - Toileting Clothing Manipulation and Hygiene: Standing Transfers/Ambulation Related to ADLs: min guard with max cues for walker safety.  cont'. to push walker to the side when approaching a surface and also before sitting down ADL Comments: pt. able to don/doff sock in seated, states her grandson is purchasing a sock aide but gift shop is sold out until [place].  reviewed benefits of using a reacher for safety and energy conservation.  discussed placing items in easy to reach areas not to high or low to prevent reaching or arching.  reviewed right wrist. pt. states it is no weaker or sore than it was prior to admit to hospital    OT Goals(current goals can now be found in the care plan section)    Visit Information  Last OT Received On: 11/06/13 History of Present Illness: Tonya SantosClara Tonya Hammond is a 80 y.o. female with a past medical history of pacemaker implant, sick sinus syndrome, hypertension, Lupus, presenting in this evening to the emergency department as a transfer from an assisted-living facility. she reports noticing a rash over her face this morning which she initially attributed to butterfly rash from lupus. Over the course of the morning her rash spread down to upper part of her trunk  and to bilateral extremities. She was administered Benadryl by nursing home staff at approximately 1:30 PM. She states that she was started on Plavix therapy approximately 2 weeks ago. While in the  emergency room she was noted to have left-sided facial droop as well as right lower extremity weakness which gradually resolved. During my encounter she reports resolution to facial droop although continues to have right lower extremity weakness. Patient was recently admitted to [facility] in December 2014 at which time she was treated for transient ischemic attack. She recently underwent Cardiolite stress test in December as well which did not show evidence for ischemia or old defect. Because of history of pacemaker implant, MRI cannot be done.                   Cognition  Cognition Arousal/Alertness: Awake/alert Behavior During Therapy: WFL for tasks assessed/performed Overall Cognitive Status: No family/caregiver present to determine baseline cognitive functioning Area of Impairment: Safety/judgement Current Attention Level: Selective Safety/Judgement: Decreased awareness of safety    Mobility  Bed Mobility Overal bed mobility: Modified Independent Transfers Overall transfer level: Needs assistance Equipment used: 4-wheeled walker Transfers: Sit to/from Stand Sit to Stand: Min guard General transfer comment: cues for technique and hand placement.              End of Session OT - End of Session Equipment Utilized During Treatment: Rolling walker Activity Tolerance: Patient tolerated treatment well Patient left: in chair;with call bell/phone within reach;with family/visitor present       Robet Leu, COTA/L 11/06/2013, 3:23 PM

## 2013-11-06 NOTE — Progress Notes (Signed)
Dr. Leonie Hammond met with patient and one of her daughters. They are interested in the EEG results, which are not yet available. They would also like Dr. Leonie Hammond to review Dr. Mellody Hammond neurologic evaluation of her that was done at [facility] (these records have been requested, but are not yet available).  Dr. Leonie Hammond will follow up with patient tomorrow.  Tonya Sabin, MSN, RN, ANVP-BC, ANP-BC, [facility] Pager: [phone] 11/06/2013 2:02 PM  I have personally obtained a history, examined the patient, evaluated imaging results, and formulated the assessment and plan of care. I agree with the above.  Tonya Contras, MD

## 2013-11-07 ENCOUNTER — Encounter: Admitting: Internal Medicine

## 2013-11-07 DIAGNOSIS — Z515 Encounter for palliative care: Secondary | ICD-10-CM

## 2013-11-07 DIAGNOSIS — G459 Transient cerebral ischemic attack, unspecified: Secondary | ICD-10-CM

## 2013-11-07 DIAGNOSIS — Z950 Presence of cardiac pacemaker: Secondary | ICD-10-CM

## 2013-11-07 DIAGNOSIS — M3500 Sicca syndrome, unspecified: Secondary | ICD-10-CM

## 2013-11-07 DIAGNOSIS — T7840XA Allergy, unspecified, initial encounter: Secondary | ICD-10-CM

## 2013-11-07 LAB — GLUCOSE, CAPILLARY
GLUCOSE-CAPILLARY: 83 mg/dL (ref 70–99)
GLUCOSE-CAPILLARY: 103 mg/dL — AB (ref 70–99)

## 2013-11-07 LAB — BASIC METABOLIC PANEL
BUN: 19 mg/dL (ref 6–23)
CALCIUM: 8.3 mg/dL — AB (ref 8.4–10.5)
CHLORIDE: 106 meq/L (ref 96–112)
CO2: 22 mEq/L (ref 19–32)
Creatinine, Ser: 0.76 mg/dL (ref 0.50–1.10)
GFR calc Af Amer: 87 mL/min — ABNORMAL LOW (ref >90)
GFR calc non Af Amer: 75 mL/min — ABNORMAL LOW (ref >90)
GLUCOSE: 84 mg/dL (ref 70–99)
POTASSIUM: 4.2 meq/L (ref 3.7–5.3)
Sodium: 141 mEq/L (ref 137–147)

## 2013-11-07 MED ORDER — MORPHINE SULFATE ER 15 MG PO TBCR: 15.0000 mg | EXTENDED_RELEASE_TABLET | Freq: Two times a day (BID) | ORAL | Status: DC

## 2013-11-07 MED ORDER — TICLOPIDINE HCL 250 MG PO TABS: 250.0000 mg | ORAL_TABLET | Freq: Two times a day (BID) | ORAL | Status: DC

## 2013-11-07 MED ORDER — DIAZEPAM 2 MG PO TABS: 2.0000 mg | ORAL_TABLET | Freq: Every evening | ORAL | Status: AC | PRN

## 2013-11-07 MED ORDER — ACETAMINOPHEN 325 MG PO TABS: 650.0000 mg | ORAL_TABLET | Freq: Four times a day (QID) | ORAL | Status: DC | PRN

## 2013-11-07 NOTE — Consult Note (Signed)
I have reviewed this case with our NP and agree with the Assessment and Plan as stated.  Katriel Cutsforth L. Evie Crumpler, MD MBA The Palliative Medicine Team at [place] Team Phone: [phone] Pager: [phone]   

## 2013-11-07 NOTE — Consult Note (Signed)
Patient Tonya Hammond:HQION:Tonya Hammond      DOB: 1927/01/13      GEX:[id number]     Consult Note from the Palliative Medicine Team at [facility]    Consult Requested by: Dr. Janee Mornhompson   PCP: Joycelyn RuaMEYERS, STEPHEN, MD Reason for Consultation: Clarification GOC and options. Phone Number:[phone]  Assessment of patients Current state: 80 yo female admitted with what is thought to be an allergic reaction to possibly Plavix. She also had symptoms that indicated a possible stroke although neurology consult doubts this was actually a true stroke (unable to perform MRI due to pacemaker). She [facility] for [facility]IA and understands her increased risk for Tonya Hammond is at high risk with her complex medical issues including sick sinus syndrome (pacemaker), lupus, Sjogren's, and multiple drug and other allergies. She has been in the hospital 2-3 times in the past month and is fairly deconditioned compared to a month ago. I had a long discussion with her and her two daughters (Amy- HCPOA and Mayotteora) and they have many concerns regarding her pain control here and at [facility]. The are asking about hosp[facility] involvement [facility] to help with these concerns and symptoms. Tonya Hammond completed a MOST form with me with the help of her daughters and decided DNR, full comfort measures with no readmission to the hospital, antibiotics to be considered, no feeding tube. She says that she would consider IV fluids but would opt to NOT receive fluids if this meant return to the hospital (we did discuss that hospice would not be initiating IV fluids and [facility] probably would not as well). This is important to note because she understands her risks and says "just let me go." Her goal is to not return to the hospital and get her pain better managed. Tonya Hammond and her daughters are ok with discharge today but only if hospice is set in place upon discharge. She complains of back pain  and a headache. Her daughter's had concern about future anaphylaxis or allergic reactions and would like an action plan put into place at [facility].    Goals of Care: 1.  Code Status: DNR   2. Scope of Treatment: Will minimize with a shift to comfort upon discharge.   4. Disposition: Return to [facility] living hopefully with the support of hospice.    3. Symptom Management:   1. Anxiety/Agitation: Lorazepam 0.5 mg po q8h prn.  2. Pain:  1. I WOULD RECOMMEND AN INCREASE IN MS CONTIN FOR BETTER PAIN MANAGEMENT.  2. I would continue Roxanol low dose for breakthrough pain. 3. Continue Acetaminophen prn as needed for headaches.  3. Bowel Regimen: I would recommend Ducolax po or supp prn for constipation.  4. Weakness: Home health PT.   4. Psychosocial: Emotional support provided to patient and daughters during conversation.  Patient Documents Completed or Given: Document Given Completed  Advanced Directives Pkt    MOST  yes  DNR  yes  Gone from My Sight    Hard Choices yes     Brief HPI: 80 yo with complex medical history and pain control issues.    ROS: +Pain, +headache, denies nausea, denies constipation    PMH:  Past Medical History  Diagnosis Date  . Pacemaker 2010    syncope  . Scoliosis   . Hypothyroidism   . HTN (hypertension)   . Sjogren's disease   . Complication of anesthesia     difficulty waking "  .  Dysrhythmia     2n degree heart block  . GERD (gastroesophageal reflux disease)   . H/O hiatal hernia   . Arthritis   . Anemia   . S/P cardiac cath     a. Reported cardiac cath in 2010 which showed one blockage of 50%.     PSH: Past Surgical History  Procedure Laterality Date  . Appendectomy    . Knee arthroscopy    . Eye surgery    . Cholecystectomy    . Abdominal hysterectomy    . Tonsillectomy     I have reviewed the FH and SH and  If appropriate update it with new information. Allergies  Allergen Reactions  .  Ace Inhibitors   . Amlodipine Other (See Comments)    Tired and feels bad  . Angiotensin Receptor Blockers   . Aspirin Anaphylaxis and Rash  . Avelox [Moxifloxacin Hcl In Nacl]   . Azor [Amlodipine-Olmesartan]   . Cephalexin   . Ciprofloxacin Hcl   . Contrast Media [Iodinated Diagnostic Agents] Anaphylaxis  . Cortizone-10 [Hydrocortisone]     Can take prednisone  . Cymbalta [Duloxetine Hcl]   . Depo-Medrol [Methylprednisolone Acetate]     Can take prednisone  . Flagyl [Metronidazole]   . Penicillins   . Plavix [Clopidogrel Bisulfate] Shortness Of Breath    Rash per pt and daughter  . Sulfa Antibiotics Rash  . Zantac [Ranitidine Hcl]   . Fentanyl Citrate   . Omnaris [Ciclesonide]   . Statins   . Wellbutrin [Bupropion] Hives  . Eliquis [Apixaban] Other (See Comments)    Bleeding in eyes per daughter   Scheduled Meds: . Chlorhexidine Gluconate Cloth  6 each Topical Q0600  . docusate sodium  200 mg Oral QHS  . isosorbide mononitrate  30 mg Oral QHS  . levothyroxine  25 mcg Oral QAC breakfast  . metoprolol succinate  50 mg Oral Daily  . morphine  15 mg Oral BID  . mupirocin ointment  1 application Nasal BID  . pantoprazole  40 mg Oral Daily   Continuous Infusions:  PRN Meds:.acetaminophen, diphenhydrAMINE, LORazepam, morphine, nitroGLYCERIN, polyvinyl alcohol, sodium chloride    BP 123/51  Pulse 62  Temp(Src) 97.8 F (36.6 C) (Oral)  Resp 20  Ht 5' (1.524 m)  Wt 60.691 kg (133 lb 12.8 oz)  BMI 26.13 kg/m2  SpO2 98%   PPS: 50%   Intake/Output Summary (Last 24 hours) at 11/07/13 1050 Last data filed at 11/07/13 0900  Gross per 24 hour  Intake   1320 ml  Output      0 ml  Net   1320 ml   LBM: 11/07/13                   Physical Exam:  General: NAD, pleasant, ill appearing HEENT: [place]/AT, no JVD Chest: No labored breathes, symmetrical CVS: RRR Abdomen: Soft, NT, ND Ext: MAE, no edema, warm to touch Neuro: Alert and oriented to  person/place/situation  Labs: CBC    Component Value Date/Time   WBC 7.3 11/05/2013 0620   RBC 3.72* 11/05/2013 0620   HGB 11.6* 11/05/2013 0620   HCT 34.5* 11/05/2013 0620   PLT 202 11/05/2013 0620   MCV 92.7 11/05/2013 0620   MCH 31.2 11/05/2013 0620   MCHC 33.6 11/05/2013 0620   RDW 12.6 11/05/2013 0620   LYMPHSABS 2.1 11/04/2013 0530   MONOABS 0.5 11/04/2013 0530   EOSABS 0.2 11/04/2013 0530   BASOSABS 0.0 11/04/2013 0530  BMET    Component Value Date/Time   NA 141 11/07/2013 0407   K 4.2 11/07/2013 0407   CL 106 11/07/2013 0407   CO2 22 11/07/2013 0407   GLUCOSE 84 11/07/2013 0407   BUN 19 11/07/2013 0407   CREATININE 0.76 11/07/2013 0407   CALCIUM 8.3* 11/07/2013 0407   GFRNONAA 75* 11/07/2013 0407   GFRAA 87* 11/07/2013 0407    CMP     Component Value Date/Time   NA 141 11/07/2013 0407   K 4.2 11/07/2013 0407   CL 106 11/07/2013 0407   CO2 22 11/07/2013 0407   GLUCOSE 84 11/07/2013 0407   BUN 19 11/07/2013 0407   CREATININE 0.76 11/07/2013 0407   CALCIUM 8.3* 11/07/2013 0407   PROT 5.2* 11/04/2013 0530   ALBUMIN 2.7* 11/04/2013 0530   AST 12 11/04/2013 0530   ALT 8 11/04/2013 0530   ALKPHOS 37* 11/04/2013 0530   BILITOT <0.2* 11/04/2013 0530   GFRNONAA 75* 11/07/2013 0407   GFRAA 87* 11/07/2013 0407      Time In Time Out Total Time Spent with Patient Total Overall Time  0900 1100     Greater than 50%  of this time was spent counseling and coordinating care related to the above assessment and plan.  Yong Channel, NP Palliative Medicine Team Pager # [phone] Team Phone # [phone]  Discussed with Dr. Arbutus Leas and case management.

## 2013-11-07 NOTE — Progress Notes (Signed)
Stroke Team Progress Note  HISTORY Tonya Hammond is an 80 y.o. female, right handed, with a past medical history significant for HTN, sick sinus syndrome, syncope, s/p pacemaker placement, hypothyroidism, Lupus, scoliosis, transferred to [facility] 11/02/2013 for further evaluation of probable stroke.  Tonya Hammond is a resident at an assisted living facility and was initially evaluated 11/02/2013 at [facility] ED after noticing a rash in her face that was thought to be secondary to an allergic reaction to plavix. She said that while in the ED she experienced " a painful sensation in my right arm that was rapidly followed by pain-numbness-weakness of my right leg, mainly the right foot and I couldn't raise it".Stated that she was surprised by the severity of the weakness when she was examined by the ED physician that afternoon. Of note, there is conflicting report about whether or not she had a left face droop that resolved in the ED.   She said that she woke up on the morning of 11/02/2013 feeling " miserable", and recalled that " right after breakfast, perhaps around 830 am, I was confused and was having difficulty talking, things were coming out of my mouth differently". Denied associated HA, vertigo, double vision, chest pain, or palpitations. CT brain at [facility] showed no acute abnormality. TTE unremarkable. Total cholesterol 138, triglycerides 66, HDL 41, LDL 84. Transferred to [facility] due to concern for an acute stroke.  LAST KNOWN WELL: UNCERTAIN, PERHAPS 355 am 11/02/13  TPA not given due to unclear time of onset, late presentation.  SUBJECTIVE Dr. Leonie Man met with patient and family to discuss history.  OBJECTIVE Most recent Vital Signs: Filed Vitals:   11/06/13 2051 11/07/13 0122 11/07/13 0559 11/07/13 0953  BP: 118/52 145/58 144/50 123/51  Pulse: 64 60 59 62  Temp: 97.7 F (36.5 C) 98 F (36.7 C) 98.2 F (36.8 C) 97.8 F (36.6 C)  TempSrc: Oral Oral Oral Oral  Resp: _0 Height:      Weight:       SpO2: 97% 98% 99% 98%   CBG (last 3)   Recent Labs  11/06/13 1652 11/06/13 2057 11/07/13 0653  GLUCAP 110* 132* 83    IV Fluid Intake:     MEDICATIONS  . Chlorhexidine Gluconate Cloth  6 each Topical Q0600  . docusate sodium  200 mg Oral QHS  . isosorbide mononitrate  30 mg Oral QHS  . levothyroxine  25 mcg Oral QAC breakfast  . metoprolol succinate  50 mg Oral Daily  . morphine  15 mg Oral BID  . mupirocin ointment  1 application Nasal BID  . pantoprazole  40 mg Oral Daily   PRN:  acetaminophen, diphenhydrAMINE, LORazepam, morphine, nitroGLYCERIN, polyvinyl alcohol, sodium chloride  Diet:  Cardiac thin liquids Activity:  Bathroom privileges with assistance. DVT Prophylaxis:  Lovenox  CLINICALLY SIGNIFICANT STUDIES Basic Metabolic Panel:   Recent Labs Lab 11/05/13 0620 11/07/13 0407  NA 143 141  K 4.1 4.2  CL 106 106  CO2 24 22  GLUCOSE 85 84  BUN 16 19  CREATININE 0.69 0.76  CALCIUM 8.4 8.3*   Liver Function Tests:   Recent Labs Lab 11/02/13 1700 11/04/13 0530  AST 17 12  ALT 10 8  ALKPHOS 48 37*  BILITOT 0.3 <0.2*  PROT 6.1 5.2*  ALBUMIN 3.3* 2.7*   CBC:   Recent Labs Lab 11/02/13 1700 11/04/13 0530 11/05/13 0620  WBC 5.2 6.6 7.3  NEUTROABS  --  3.8  --   HGB  12.4 10.6* 11.6*  HCT 37.3 30.4* 34.5*  MCV 92.3 91.6 92.7  PLT 160 170 202   Coagulation:   Recent Labs Lab 11/02/13 1700  LABPROT 13.5  INR 1.05   Cardiac Enzymes: No results found for this basename: CKTOTAL, CKMB, CKMBINDEX, TROPONINI,  in the last 168 hours Urinalysis:   Recent Labs Lab 11/02/13 [place] 1.006  PHURINE 7.0  GLUCOSEU NEGATIVE  HGBUR NEGATIVE  BILIRUBINUR NEGATIVE  KETONESUR NEGATIVE  PROTEINUR NEGATIVE  UROBILINOGEN 0.2  NITRITE NEGATIVE  LEUKOCYTESUR NEGATIVE   Lipid Panel    Component Value Date/Time   CHOL 138 11/03/2013 0550   TRIG 66 11/03/2013 0550   HDL 41 11/03/2013 0550   CHOLHDL 3.4 11/03/2013 0550   VLDL  13 11/03/2013 0550   LDLCALC 84 11/03/2013 0550   HgbA1C  Lab Results  Component Value Date   HGBA1C 5.6 11/03/2013    Urine Drug Screen:   No results found for this basename: labopia,  cocainscrnur,  labbenz,  amphetmu,  thcu,  labbarb    Alcohol Level: No results found for this basename: ETH,  in the last 168 hours  Dg Chest 2 View 11/02/2013    Small bilateral pleural effusions and pulmonary vascular congestion.     Ct Head Wo Contrast 11/04/2013    1. No acute intracranial abnormalities. 2. No change from the previous exam.     Ct Head Wo Contrast 11/02/2013    1. No acute intracranial abnormalities. 2. No significant abnormality of either globe or orbit. 3. Mild atrophy and mild chronic microvascular ischemic change.    MRI of the brain  permanent pacemaker  MRA of the brain  permanent pacemaker  2D Echocardiogram  ejection fraction 55-60%. No cardiac source of emboli identified.  Carotid Doppler  No evidence of hemodynamically significant internal carotid artery stenosis. Vertebral artery flow is antegrade.   EEG  a normal awake and drowsy EEG. The presence of rare intermittent left temporal theta slowing is a normal phenomenon in this age population.  EKG sinus rhythm rate 71 beats per minute  Therapy Recommendations home health PT  Physical Exam   Neurologic Examination:  Mental Status:  Alert, oriented, thought content appropriate. Speech fluent without evidence of aphasia. Able to follow 3 step commands without difficulty.  Cranial Nerves:  II: Discs flat bilaterally; Visual fields grossly normal, pupils equal, round, reactive to light and accommodation  III,IV, VI: ptosis not present, extra-ocular motions intact bilaterally  V,VII: smile symmetric, facial light touch sensation normal bilaterally  VIII: hearing normal bilaterally  IX,X: gag reflex present  XI: bilateral shoulder shrug  XII: midline tongue extension without atrophy or fasciculations  Motor:   Significant for right leg weakness.  Tone and bulk:normal tone throughout  Sensory: Pinprick and light touch intact throughout, bilaterally  Deep Tendon Reflexes:  Right: Upper Extremity Left: Upper extremity  biceps (C-5 to C-6) 2/4 biceps (C-5 to C-6) 2/4  tricep (C7) 2/4 triceps (C7) 2/4  Brachioradialis (C6) 2/4 Brachioradialis (C6) 2/4  Lower Extremity Lower Extremity  quadriceps (L-2 to L-4) 2/4 quadriceps (L-2 to L-4) 2/4  Achilles (S1) 2/4 Achilles (S1) 2/4  Plantars:  Right: downgoing Left: downgoing  Cerebellar:  normal finger-to-nose, normal heel-to-shin test  Gait:  Not tested.   ASSESSMENT [name] is a 80 y.o. female presenting with right hemiparesis. TPA was not administered as the patient was outside the window for treatment. CTs of the head revealed no acute abnormalities.  Cannot have MRI secondary to a permanent pacemaker. History is not consistent with acute stroke - doubt stroke, TIA or seizure. Possible pain/heaviness on right leg due to allergic reaction s/p 142m solumedrol & benadryl, right foot rash. On clopidogrel 75 mg orally every day prior to admission. Now on no antiplatelet for secondary stroke prevention. Patient with resultant right lower extremity weakness. Work up completed.  [facility]ie [facility]has [facility] as patient generated document related to hx.    Permanent pacemaker for sick sinus syndrome  Hypertension history  Lupus  TIA December 2014 - Plavix therapy initiated at that time. Tolerated at that time.  Anemia - hemoglobin 10.6 hematocrit 30.4  Small bilateral pleural effusions and pulmonary vascular congestion by chest x-ray  Recent report of allergy to plavix when it was started along with protonix with resultant rash and angioedema (of not, pt has been on plavix in the past without side effect)  Also pt with reported allergy to aspirin  atrial fibrillation documented in  prior records, on eliquis at that time. Further workup was completed which refuted her atrial fibrillation diagnosis. At this time, based on history and test review, pt is not thought to have a history of atrial fibrillation.   Hospital day # 5  TREATMENT/PLAN  No antiplatelets at discharge due to aspirin and plavix allergies. Have discussed ticlid as a possible discharge option - it is currently on national backorder. Will try to order.   [facility]  No neurologic followup indicated.  Nothing further to add from the stroke standpoint. Will sign-off. Please call for questions. Dr. SLeonie Mandiscussed with Dr. TCarles Collet  SBurnetta Sabin MSN, RN, ANVP-BC, ANP-BC, [facility] Pager: [phone]/01/2014 11:15 AM  I have personally obtained a history, examined the patient, evaluated imaging results, and formulated the assessment and plan of care. I agree with the above.  PAntony Contras MD

## 2013-11-07 NOTE — Progress Notes (Signed)
OT Cancellation Note  Patient Details Name: Jerl SantosClara Hammond MRN: [id number] DOB: Sep 02, 1927   Cancelled Treatment:    Reason Eval/Treat Not Completed: Fatigue/lethargy limiting ability to participate - pt politely declined.  Reynolds AmericanWendi Keifer Habib, OTR/L [phone]  11/07/2013, 12:05 PM

## 2013-11-07 NOTE — Discharge Summary (Addendum)
Physician Discharge Summary  Kharter Sestak EAV:[id number] DOB: 1927/04/13 DOA: 11/02/2013  PCP: Joycelyn Rua, MD  Admit date: 11/02/2013 Discharge date: 11/07/2013  Recommendations for Outpatient Follow-up:  1. Pt will need to follow up with PCP in 2 weeks post discharge 2. Please obtain BMP to evaluate electrolytes and kidney function 3. Please also check CBC every 2 weeks x 3 months while on Ticlid and stop Ticlid if pt becomes leukopenic 4. Please check patient's BP each shift daily and stop nifedipine if sbp <120  Discharge Diagnoses:  Principal Problem:   Right sided weakness Active Problems:   Allergic reaction   HTN (hypertension)   Pacemaker   Lupus   CVA (cerebral vascular accident)   Rash and nonspecific skin eruption #1 right-sided weakness/TIA Clinical improvement. Patient and daughter stated that when she had the right-sided weakness in the emergency room daughter states that patient had a stiffening of her right upper extremity right lower extremity with some associated confusion after that. Concern also is for possible seizures versus stroke. Repeat CT of the head was negative. Patient unable to get an MRI of the head secondary to pacemaker. 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 Patient had bleeding in eye to eliquis. Discontinued Aggrenox. Patient was recently placed on Plavix and had presented to the ED secondary to possible allergic reaction causing a rash. EEG negative. Carotid Doppler with no significant ICA stenosis. 2-D echo with no source of emboli. Patient seen by neurology and Per neurology no further workup needed at this time.  -discussed case with Dr. Pearlean Brownie (neurology)  on day of d/c--he cleared pt for d/c with Ticlid 250mg  bid with CBC monitoring as discussed below -CBC has to be checked every 2 weeks for 3 months--stop ticlid if pt becomes leukopenic #2 rash  Possible drug reaction to Plavix. Rash has improved with Benadryl and  discontinuation of Plavix. Continue prednisone 5 mg home dose. Follow.  #3 hypertension  Stable. Continue imdur, Lopressor, lasix.  -family is insistent to restart nifedipine against my recommendation -I recommended that pt's BP be checked each shift daily at ALF and to discontinue nifedipine if SBP <120 #4 hypothyroidism  TSH within normal limits at 0.709. Continue Synthroid.  #5 sinus syndrome status post pacemaker/coronary artery disease  Stable. Continue Lopressor, Lasix, imdur.  #6 history of lupus  Continue steroid therapy. Outpatient followup.  #7 prophylaxis  SCDs for DVT prophylaxis.  Code Status: DO NOT RESUSCITATE  Family Communication: Updated patient and daughter at bedside.   Consultants:  Neurology: Dr. Cyril Mourning 11/03/2013 Procedures:  CT head 11/02/2013, 11/04/2013  Chest x-ray 11/02/2013  2-D echo 11/03/2013  EEG 11/05/13  Carotid Dopplers 11/04/2013   Discharge Condition: stable Disposition: ALF on 11/07/13  Diet:heart healthy Wt Readings from Last 3 Encounters:  11/02/13 60.691 kg (133 lb 12.8 oz)  10/19/12 68.04 kg (150 lb)  04/28/12 69.854 kg (154 lb)    History of present illness:   80 y.o. female with a past medical history of pacemaker implant, sick sinus syndrome, hypertension, Lupus, presenting in this evening to the emergency department as a transfer from an assisted-living facility. she reports noticing a rash over her face this morning which she initially attributed to butterfly rash from lupus. Over the course of the morning her rash spread down to upper part of her trunk and to bilateral extremities. She was administered Benadryl by nursing home staff at approximately 1:30 PM. She states that she was started on Plavix therapy approximately 2 weeks  ago. While in the emergency room she was noted to have left-sided facial droop as well as right lower extremity weakness which gradually resolved. During my encounter she reports resolution to facial droop  although continues to have right lower extremity weakness. Patient was recently admitted to [facility] in December 2014 at which time she was treated for transient ischemic attack. She recently underwent Cardiolite stress test in December as well which did not show evidence for ischemia or old defect. Because of history of pacemaker implant, MRI cannot be done.     Consultants: Neurology   Discharge Exam: Filed Vitals:   11/07/13 0953  BP: 123/51  Pulse: 62  Temp: 97.8 F (36.6 C)  Resp:    Filed Vitals:   11/06/13 2051 11/07/13 0122 11/07/13 0559 11/07/13 0953  BP: 118/52 145/58 144/50 123/51  Pulse: 64 60 59 62  Temp: 97.7 F (36.5 C) 98 F (36.7 C) 98.2 F (36.8 C) 97.8 F (36.6 C)  TempSrc: Oral Oral Oral Oral  Resp: 22 20 20    Height:      Weight:      SpO2: 97% 98% 99% 98%   General: A&O x 3, NAD, pleasant, cooperative Cardiovascular: RRR, no rub, no gallop, no S3 Respiratory: bibasilar crackles. No wheezing. Good air movement. Abdomen:soft, nontender, nondistended, positive bowel sounds Extremities: No edema, No lymphangitis, no petechiae  Discharge Instructions      Discharge Orders   Future Appointments Provider Department Dept Phone   12/10/2013 9:45 AM Hillis Range, MD [facility] [phone]   Future Orders Complete By Expires   Diet - low sodium heart healthy  As directed    Increase activity slowly  As directed        Medication List    STOP taking these medications       clopidogrel 75 MG tablet  Commonly known as:  PLAVIX     loratadine 10 MG tablet  Commonly known as:  CLARITIN      TAKE these medications       acetaminophen 500 MG tablet  Commonly known as:  TYLENOL  Take 1 tablet (500 mg total) by mouth every 6 (six) hours as needed for pain. But not with Norco     acetaminophen 325 MG tablet  Commonly known as:  TYLENOL  Take 2 tablets (650 mg total) by mouth every 6 (six) hours as needed for mild  pain, fever or headache.     albuterol 108 (90 BASE) MCG/ACT inhaler  Commonly known as:  PROVENTIL HFA;VENTOLIN HFA  Inhale 1-2 puffs into the lungs every 6 (six) hours as needed. Wheezing or shortness of breath     BIOFREEZE 4 % Gel  Generic drug:  Menthol (Topical Analgesic)  Apply 1 application topically 4 (four) times daily as needed. Joint pain     cetirizine 10 MG chewable tablet  Commonly known as:  ZYRTEC  Chew 1 tablet (10 mg total) by mouth daily.     CITRACAL PETITES/VITAMIN D 200-250 MG-UNIT Tabs  Generic drug:  Calcium Citrate-Vitamin D  Take 2 tablets by mouth daily.     diazepam 2 MG tablet  Commonly known as:  VALIUM  Take 1 tablet (2 mg total) by mouth at bedtime as needed for anxiety.     docusate sodium 100 MG capsule  Commonly known as:  COLACE  Take 200 mg by mouth at bedtime.     estrogens (conjugated) 0.3 MG tablet  Commonly known as:  PREMARIN  Take 0.3 mg by mouth daily.     furosemide 20 MG tablet  Commonly known as:  LASIX  Take 20 mg by mouth daily.     HAIR/SKIN/NAILS PO  Take 1 tablet by mouth daily.     isosorbide mononitrate 30 MG 24 hr tablet  Commonly known as:  IMDUR  Take 1 tablet by mouth at bedtime.     levothyroxine 25 MCG tablet  Commonly known as:  SYNTHROID, LEVOTHROID  Take 25 mcg by mouth daily.     lidocaine 5 %  Commonly known as:  LIDODERM  Place 1 patch onto the skin daily. Remove & Discard patch within 12 hours or as directed by MD     meclizine 12.5 MG tablet  Commonly known as:  ANTIVERT  Take 12.5 mg by mouth 2 (two) times daily.     metoprolol succinate 50 MG 24 hr tablet  Commonly known as:  TOPROL-XL  Take 1 tablet (50 mg total) by mouth daily.     morphine 15 MG 12 hr tablet  Commonly known as:  MS CONTIN  Take 1 tablet (15 mg total) by mouth 2 (two) times daily.     morphine 10 MG/5ML solution  Take 10 mg by mouth every 8 (eight) hours as needed for moderate pain or severe pain.     multivitamin  with minerals Tabs tablet  Take 1 tablet by mouth daily.     NIFEdipine 10 MG capsule  Commonly known as:  PROCARDIA  Take 10 mg by mouth daily.     nitroGLYCERIN 0.4 MG SL tablet  Commonly known as:  NITROSTAT  Place 1 tablet (0.4 mg total) under the tongue every 5 (five) minutes as needed for chest pain (up to 3 doses).     pantoprazole 40 MG tablet  Commonly known as:  PROTONIX  Take 1 tablet (40 mg total) by mouth daily.     PATADAY 0.2 % Soln  Generic drug:  Olopatadine HCl  Apply 1 drop to eye as needed.     predniSONE 5 MG tablet  Commonly known as:  DELTASONE  Take 5 mg by mouth daily.     PROBIOTIC DAILY PO  Take 1 tablet by mouth daily.     senna 8.6 MG Tabs tablet  Commonly known as:  SENOKOT  Take 2 tablets by mouth at bedtime.     ticlopidine 250 MG tablet  Commonly known as:  TICLID  Take 1 tablet (250 mg total) by mouth 2 (two) times daily with a meal.         The results of significant diagnostics from this hospitalization (including imaging, microbiology, ancillary and laboratory) are listed below for reference.    Significant Diagnostic Studies: Dg Chest 2 View  11/02/2013   CLINICAL DATA:  Chest pain and fever.  EXAM: CHEST  2 VIEW  COMPARISON:  10/19/2012  FINDINGS: There is a left chest wall pacer device with lead in the right atrial appendage and right ventricle. The heart size appears normal. Small bilateral pleural effusions are identified. Pulmonary vascular congestion without over edema is noted. The visualized osseous structures are remarkable for scoliosis and multilevel spondylosis.  IMPRESSION: 1. Small bilateral pleural effusions and pulmonary vascular congestion.   Electronically Signed   By: Signa Kell M.D.   On: 11/02/2013 17:20   Ct Head Wo Contrast  11/04/2013   CLINICAL DATA:  Followup stroke. Headache in the region of the left eye.  EXAM: CT  HEAD WITHOUT CONTRAST  TECHNIQUE: Contiguous axial images were obtained from the base of  the skull through the vertex without intravenous contrast.  COMPARISON:  11/02/2013  FINDINGS: Ventricles are normal in configuration. There is ventricular and sulcal enlargement reflecting age related volume loss. No hydrocephalus.  There are no parenchymal masses or mass effect. Periventricular white matter hypoattenuation is noted consistent with chronic microvascular ischemic change there is no evidence of a cortical infarct.  There are no extra-axial masses or abnormal fluid collections.  There is no intracranial hemorrhage.  The visualized sinuses and mastoid air cells are clear.  IMPRESSION: 1. No acute intracranial abnormalities. 2. No change from the previous exam.   Electronically Signed   By: Amie Portlandavid  Ormond M.D.   On: 11/04/2013 07:18   Ct Head Wo Contrast  11/02/2013   CLINICAL DATA:  Loss of vision in the left eye.  EXAM: CT HEAD WITHOUT CONTRAST  TECHNIQUE: Contiguous axial images were obtained from the base of the skull through the vertex without intravenous contrast.  COMPARISON:  None.  FINDINGS: Ventricles are normal in configuration. There is ventricular and sulcal enlargement reflecting mild, age related atrophy. No hydrocephalus.  There are no parenchymal masses or mass effect. Patchy periventricular white matter hypoattenuation is noted consistent with mild chronic microvascular ischemic change. There is no evidence of a cortical infarct.  No extra-axial masses or abnormal fluid collections.  There is no intracranial hemorrhage.  Globes and orbits are unremarkable.  Visualized sinuses and mastoid air cells are clear.  IMPRESSION: 1. No acute intracranial abnormalities. 2. No significant abnormality of either globe or orbit. 3. Mild atrophy and mild chronic microvascular ischemic change.   Electronically Signed   By: Amie Portlandavid  Ormond M.D.   On: 11/02/2013 16:44     Microbiology: Recent Results (from the past 240 hour(s))  MRSA PCR SCREENING     Status: Abnormal   Collection Time     11/03/13  3:01 AM      Result Value Range Status   MRSA by PCR POSITIVE (*) NEGATIVE Final   Comment:            The GeneXpert MRSA Assay (FDA     approved for NASAL specimens     only), is one component of a     comprehensive MRSA colonization     surveillance program. It is not     intended to diagnose MRSA     infection nor to guide or     monitor treatment for     MRSA infections.     RESULT CALLED TO, READ BACK BY AND VERIFIED WITH:     C SIMON AT 0719 ON 01.31.2015 BY NBROOKS     Labs: Basic Metabolic Panel:  Recent Labs Lab 11/02/13 1700 11/04/13 0530 11/05/13 0620 11/07/13 0407  NA 141 143 143 141  K 4.1 3.6* 4.1 4.2  CL 103 105 106 106  CO2 26 27 24 22   GLUCOSE 108* 88 85 84  BUN 14 15 16 19   CREATININE 0.78 0.78 0.69 0.76  CALCIUM 9.2 8.2* 8.4 8.3*   Liver Function Tests:  Recent Labs Lab 11/02/13 1700 11/04/13 0530  AST 17 12  ALT 10 8  ALKPHOS 48 37*  BILITOT 0.3 <0.2*  PROT 6.1 5.2*  ALBUMIN 3.3* 2.7*   No results found for this basename: LIPASE, AMYLASE,  in the last 168 hours No results found for this basename: AMMONIA,  in the last 168 hours CBC:  Recent Labs  Lab 11/02/13 1700 11/04/13 0530 11/05/13 0620  WBC 5.2 6.6 7.3  NEUTROABS  --  3.8  --   HGB 12.4 10.6* 11.6*  HCT 37.3 30.4* 34.5*  MCV 92.3 91.6 92.7  PLT 160 170 202   Cardiac Enzymes: No results found for this basename: CKTOTAL, CKMB, CKMBINDEX, TROPONINI,  in the last 168 hours BNP: No components found with this basename: POCBNP,  CBG:  Recent Labs Lab 11/06/13 1138 11/06/13 1652 11/06/13 2057 11/07/13 0653 11/07/13 1127  GLUCAP 92 110* 132* 83 103*    Time coordinating discharge:  Greater than 30 minutes  Signed:  Toy Eisemann, DO [facility] Pager: [phone] 11/07/2013, 11:49 AM

## 2013-11-08 NOTE — Progress Notes (Signed)
Late entry- choice offered by Helmut MusterAlicia NP for home hospice; pt/family chose [facility]; Helmut MusterAlicia NP notified Valente DavidMargie Lester RN with Hospice; CM  notified Social Worker of discharging pt back to facility with hospice following; Abelino DerrickB Sheala Dosh RN

## 2013-12-10 ENCOUNTER — Ambulatory Visit (INDEPENDENT_AMBULATORY_CARE_PROVIDER_SITE_OTHER): Payer: Medicare Other | Admitting: Internal Medicine

## 2013-12-10 ENCOUNTER — Encounter: Payer: Self-pay | Admitting: Internal Medicine

## 2013-12-10 VITALS — BP 141/64 | HR 60 | Ht 60.0 in | Wt 138.4 lb

## 2013-12-10 DIAGNOSIS — I4891 Unspecified atrial fibrillation: Secondary | ICD-10-CM

## 2013-12-10 DIAGNOSIS — Z950 Presence of cardiac pacemaker: Secondary | ICD-10-CM

## 2013-12-10 DIAGNOSIS — I495 Sick sinus syndrome: Secondary | ICD-10-CM

## 2013-12-10 DIAGNOSIS — I480 Paroxysmal atrial fibrillation: Secondary | ICD-10-CM

## 2013-12-10 DIAGNOSIS — I10 Essential (primary) hypertension: Secondary | ICD-10-CM

## 2013-12-10 DIAGNOSIS — I6350 Cerebral infarction due to unspecified occlusion or stenosis of unspecified cerebral artery: Secondary | ICD-10-CM

## 2013-12-10 DIAGNOSIS — I639 Cerebral infarction, unspecified: Secondary | ICD-10-CM

## 2013-12-10 DIAGNOSIS — G459 Transient cerebral ischemic attack, unspecified: Secondary | ICD-10-CM

## 2013-12-10 LAB — MDC_IDC_ENUM_SESS_TYPE_INCLINIC
Brady Statistic RA Percent Paced: 63 %
Implantable Pulse Generator Serial Number: 151161
Lead Channel Impedance Value: 440 Ohm
Lead Channel Pacing Threshold Pulse Width: 0.4 ms
Lead Channel Sensing Intrinsic Amplitude: 4.7 mV
Lead Channel Setting Pacing Amplitude: 2 V
Lead Channel Setting Pacing Pulse Width: 0.4 ms
MDC IDC MSMT LEADCHNL RA IMPEDANCE VALUE: 380 Ohm
MDC IDC MSMT LEADCHNL RA PACING THRESHOLD AMPLITUDE: 0.7 V
MDC IDC MSMT LEADCHNL RA PACING THRESHOLD PULSEWIDTH: 0.4 ms
MDC IDC MSMT LEADCHNL RA SENSING INTR AMPL: 2.5 mV
MDC IDC MSMT LEADCHNL RV PACING THRESHOLD AMPLITUDE: 0.5 V
MDC IDC SESS DTM: 20150309040000
MDC IDC SET LEADCHNL RV PACING AMPLITUDE: 1.1 V
MDC IDC SET LEADCHNL RV SENSING SENSITIVITY: 2.5 mV
MDC IDC STAT BRADY RV PERCENT PACED: 10 %

## 2013-12-10 LAB — CBC WITH DIFFERENTIAL/PLATELET
Basophils Absolute: 0 10*3/uL (ref 0.0–0.1)
Basophils Relative: 0.3 % (ref 0.0–3.0)
Eosinophils Absolute: 0 10*3/uL (ref 0.0–0.7)
Eosinophils Relative: 0.5 % (ref 0.0–5.0)
HEMATOCRIT: 38.1 % (ref 36.0–46.0)
Hemoglobin: 12.6 g/dL (ref 12.0–15.0)
LYMPHS ABS: 2 10*3/uL (ref 0.7–4.0)
LYMPHS PCT: 22.8 % (ref 12.0–46.0)
MCHC: 33 g/dL (ref 30.0–36.0)
MCV: 92.8 fl (ref 78.0–100.0)
MONOS PCT: 6.2 % (ref 3.0–12.0)
Monocytes Absolute: 0.5 10*3/uL (ref 0.1–1.0)
Neutro Abs: 6 10*3/uL (ref 1.4–7.7)
Neutrophils Relative %: 70.2 % (ref 43.0–77.0)
PLATELETS: 207 10*3/uL (ref 150.0–400.0)
RBC: 4.1 Mil/uL (ref 3.87–5.11)
RDW: 12.9 % (ref 11.5–14.6)
WBC: 8.6 10*3/uL (ref 4.5–10.5)

## 2013-12-10 LAB — BASIC METABOLIC PANEL
BUN: 19 mg/dL (ref 6–23)
CO2: 31 meq/L (ref 19–32)
Calcium: 9 mg/dL (ref 8.4–10.5)
Chloride: 103 mEq/L (ref 96–112)
Creatinine, Ser: 0.9 mg/dL (ref 0.4–1.2)
GFR: 66.69 mL/min (ref >60.00)
Glucose, Bld: 76 mg/dL (ref 70–99)
Potassium: 3.7 mEq/L (ref 3.5–5.1)
Sodium: 140 mEq/L (ref 135–145)

## 2013-12-10 MED ORDER — RIVAROXABAN 15 MG PO TABS: 15.0000 mg | ORAL_TABLET | Freq: Two times a day (BID) | ORAL | Status: AC

## 2013-12-10 NOTE — Patient Instructions (Addendum)
  Your physician recommends that you schedule a follow-up appointment in: 2 weeks with Audrie LiaSally Earl, Pharm D in anti-coag clinic for Xarelto   Your physician wants you to follow-up in: 6 months with device clinic and 12 months with Dr Jacquiline DoeAllred You will receive a reminder letter in the mail two months in advance. If you don't receive a letter, please call our office to schedule the follow-up appointment.  Your physician has recommended you make the following change in your medication:  1) Start Xarelto 15mg  daily---take at night with food  Your physician recommends that you return for lab work today: cbc/bmp      See Dr Pearlean BrownieSethi back ---saw patient in hospital

## 2013-12-12 ENCOUNTER — Encounter: Payer: Self-pay | Admitting: Internal Medicine

## 2013-12-12 DIAGNOSIS — I495 Sick sinus syndrome: Secondary | ICD-10-CM | POA: Insufficient documentation

## 2013-12-12 DIAGNOSIS — I480 Paroxysmal atrial fibrillation: Secondary | ICD-10-CM | POA: Insufficient documentation

## 2013-12-12 NOTE — Progress Notes (Signed)
Tonya Jenny, MD: Primary Cardiologist:  Dr Hanley Hays [facility])  Tonya Hammond is a 80 y.o. female with a h/o sinus bradycardia/SSS and syncope sp PPM ([facility]) in [place] who presents today to establish care in the Electrophysiology device clinic.  The patient reports doing very well since having a pacemaker implanted and remains very active despite her age.  She has had no further syncope since the device was implanted.   Interestingly, she presented to [facility] 11/02/13 with acute stroke.  CT was unrevealing and MRI could not be performed.  Her pacemaker was not interrogated during her hospitalization.  Interrogation today however reveals that she did have an episode of afib on 11/02/13, lasting 1 minute and 9 seconds.  Today, she  denies symptoms of palpitations, chest pain, shortness of breath, orthopnea, PND, lower extremity edema, dizziness, presyncope, syncope, or further neurologic sequela.  The patientis tolerating medications without difficulties and is otherwise without complaint today.   Past Medical History  Diagnosis Date  . Syncope 2010    s/p PPM in [place]  . Sick sinus syndrome   . Hypothyroidism   . HTN (hypertension)   . Sjogren's disease   . GERD (gastroesophageal reflux disease)   . H/O hiatal hernia   . Arthritis   . Anemia   . S/P cardiac cath     a. Reported cardiac cath in 2010 which showed one blockage of 50%.  . Atrial fibrillation 1/15    detected by ppm interrogation 3/15  . Dizziness   . Internal hemorrhoids   . Osteoporosis   . Vitamin D deficiency   . Hyperlipidemia   . Barrett esophagus   . Positional vertigo   . Cardiac pacemaker in situ   . Chronic pain   . Stroke 1/15    evaluated at [facility]   Past Surgical History  Procedure Laterality Date  . Appendectomy    . Knee arthroscopy    . Eye surgery    . Cholecystectomy    . Abdominal hysterectomy    . Tonsillectomy    . Cardiac catheterization  2010   Reported cardiac cath in 2010 which showed one blockage of 50%.  . Bilateral oophorectomy    . Pacemaker insertion  2010    [place] for sick sinus syndrome with syncope    History   Social History  . Marital Status: Widowed    Spouse Name: N/A    Number of Children: N/A  . Years of Education: N/A   Occupational History  . Not on file.   Social History Main Topics  . Smoking status: Never Smoker   . Smokeless tobacco: Never Used  . Alcohol Use: No  . Drug Use: No  . Sexual Activity: Not on file   Other Topics Concern  . Not on file   Social History Narrative  . No narrative on file    Family History  Problem Relation Age of Onset  . Heart disease Mother 109    CHF  . Depression Mother   . Arthritis Mother   . Anxiety disorder Mother   . Cancer Father   . Alcohol abuse Father   . Heart disease Father   . Hypertension Father   . Arthritis Father   . Cancer Sister   . Hypertension Sister   . Stroke Paternal Grandmother     Allergies  Allergen Reactions  . Ace Inhibitors   . Amlodipine Other (See Comments)  Tired and feels bad  . Angiotensin Receptor Blockers   . Aspirin Anaphylaxis and Rash  . Avelox [Moxifloxacin Hcl In Nacl]   . Azor [Amlodipine-Olmesartan]   . Cephalexin   . Ciprofloxacin Hcl   . Contrast Media [Iodinated Diagnostic Agents] Anaphylaxis  . Cortizone-10 [Hydrocortisone]     Can take prednisone  . Cymbalta [Duloxetine Hcl]   . Depo-Medrol [Methylprednisolone Acetate]     Can take prednisone  . Flagyl [Metronidazole]   . Iodine Anaphylaxis    Contrast media  . Other Anaphylaxis    Penicillin Cross Reactors  . Penicillins   . Plavix [Clopidogrel Bisulfate] Shortness Of Breath    Rash per pt and daughter  . Sulfa Antibiotics Rash    Also Sulfa Cross Reactors    . Zantac [Ranitidine Hcl]   . Fentanyl Citrate   . Omnaris [Ciclesonide]   . Statins   . Wellbutrin [Bupropion] Hives  . Ciprofloxacin   . Eliquis [Apixaban]  Other (See Comments)    Bleeding in eyes per daughter  . Nsaids   . Zantac  [Ranitidine]     Current Outpatient Prescriptions  Medication Sig Dispense Refill  . acetaminophen (TYLENOL) 500 MG tablet Take 500 mg by mouth every 6 (six) hours as needed.      Marland Kitchen albuterol (PROVENTIL HFA;VENTOLIN HFA) 108 (90 BASE) MCG/ACT inhaler Inhale 1-2 puffs into the lungs every 6 (six) hours as needed. Wheezing or shortness of breath      . Calcium Citrate-Vitamin D (CITRACAL PETITES/VITAMIN D) 200-250 MG-UNIT TABS Take 2 tablets by mouth daily.      . cetirizine (ZYRTEC) 10 MG chewable tablet Chew 1 tablet (10 mg total) by mouth daily.      . diazepam (VALIUM) 2 MG tablet Take 1 tablet (2 mg total) by mouth at bedtime as needed for anxiety.  30 tablet  0  . docusate sodium (COLACE) 100 MG capsule Take 200 mg by mouth at bedtime.       Marland Kitchen estrogens, conjugated, (PREMARIN) 0.3 MG tablet Take 0.3 mg by mouth daily.      . furosemide (LASIX) 20 MG tablet Take 20 mg by mouth daily.       . isosorbide mononitrate (IMDUR) 30 MG 24 hr tablet Take 1 tablet by mouth at bedtime.       Marland Kitchen levothyroxine (SYNTHROID, LEVOTHROID) 25 MCG tablet Take 25 mcg by mouth daily.      Marland Kitchen lidocaine (LIDODERM) 5 % Place 1 patch onto the skin daily. Remove & Discard patch within 12 hours or as directed by MD      . meclizine (ANTIVERT) 12.5 MG tablet Take 12.5 mg by mouth 2 (two) times daily.      . Menthol, Topical Analgesic, (BIOFREEZE) 4 % GEL Apply 1 application topically 4 (four) times daily as needed. Joint pain      . metoprolol succinate (TOPROL-XL) 50 MG 24 hr tablet Take 1 tablet (50 mg total) by mouth daily.  30 tablet  1  . morphine (MSIR) 30 MG tablet Take 30 mg by mouth every 4 (four) hours as needed for severe pain.      Marland Kitchen morphine 10 MG/5ML solution Take 5 mg by mouth as needed for severe pain.      . Multiple Vitamin (MULTIVITAMIN WITH MINERALS) TABS Take 1 tablet by mouth daily.      . Multiple Vitamins-Minerals  (HAIR/SKIN/NAILS PO) Take 1 tablet by mouth daily.      Marland Kitchen NIFEdipine (PROCARDIA) 10  MG capsule Take 10 mg by mouth daily.      . nitroGLYCERIN (NITROSTAT) 0.4 MG SL tablet Place 1 tablet (0.4 mg total) under the tongue every 5 (five) minutes as needed for chest pain (up to 3 doses).  25 tablet  1  . Olopatadine HCl (PATADAY) 0.2 % SOLN Apply 1 drop to eye as needed.      . pantoprazole (PROTONIX) 40 MG tablet Take 1 tablet (40 mg total) by mouth daily.  30 tablet  1  . predniSONE (DELTASONE) 5 MG tablet Take 5 mg by mouth daily.      . Probiotic Product (PROBIOTIC DAILY PO) Take 1 tablet by mouth daily.      Marland Kitchen. senna (SENOKOT) 8.6 MG TABS Take 2 tablets by mouth at bedtime.      . Rivaroxaban (XARELTO) 15 MG TABS tablet Take 1 tablet (15 mg total) by mouth 2 (two) times daily with a meal.  30 tablet  11   No current facility-administered medications for this visit.    ROS- all systems are reviewed and negative except as per HPI  Physical Exam: Filed Vitals:   12/10/13 1016  BP: 141/64  Pulse: 60  Height: 5' (1.524 m)  Weight: 138 lb 6.4 oz (62.778 kg)    GEN- The patient is elderly appearing, alert and oriented x 3 today.   Head- normocephalic, atraumatic Eyes-  Sclera clear, conjunctiva pink Ears- hearing intact Oropharynx- clear Neck- supple,  Lungs- Clear to ausculation bilaterally, normal work of breathing Chest- pacemaker pocket is well healed Heart- Regular rate and rhythm  GI- soft, NT, ND, + BS Extremities- no clubbing, cyanosis, or edema MS- age appropriate atrophy Skin- no rash or lesion Psych- euthymic mood, full affect Neuro- strength and sensation are intact  Pacemaker interrogation- reviewed in detail today,  See PACEART report  Assessment and Plan:  1.  Sick sinus syndrome Normal pacemaker function See Pace Art report No changes today  2. Recent stroke She was admitted 11/02/13 with initially TIA symptoms and subsequently stroke.  Pacemaker interrogation  today reveals that on 11/02/13 she had afib at around 14:58 pm which appears to coincide with the onset of her initial TIA symptoms.  Her pacemaker has not been interrogated since 9/14. Given this information, I would recommend anticoagulation over antiplatelet therapy for stroke prevention.  I have encouraged her to follow-up with Dr Pearlean BrownieSethi for his opinion as well.  3. afib Pacemaker interrogation confirms afib. Her chads2vasc score is at least 6.  She is at very high risk for recurrent stroke.  She is also at high risk for bleeding and has not tolerated antiplatelet agents previously. Today, I discussed coumadin and novel anticoagulants including pradaxa, xarelto, and eliquis today as indicated for risk reduction in stroke and systemic emboli with nonvalvular atrial fibrillation.  Risks, benefits, and alternatives to each of these drugs were discussed at length today.  She would prefer xarelto.  I will start xarelto at 15mg  daily today.  Obtain a bmet and cbc today.  She will return to be further assessed in our anticoagulation clinic in 2-3 weeks.  I have also encourage her to contact Dr Marlis EdelsonSethi's office to arrange follow-up.  4. HTN Stable No change required today   Return to the device clinic in 6 months I will see again in 1 year She will follow with Dr Hanley Haysosario (her primary cardiologist) as scheduled.

## 2013-12-21 ENCOUNTER — Telehealth: Payer: Self-pay | Admitting: Internal Medicine

## 2013-12-21 NOTE — Telephone Encounter (Signed)
She says she understand her risk but due to many complications with these types of medications she is not going to start the Xarelto.  She understands her increased risk for stroke but is very addiment that she is not going to try it again due to her complications with the same kind of medications prior

## 2013-12-21 NOTE — Telephone Encounter (Signed)
New message     FYI Pt cancelled next wed appt with the [facility]mad[facility] clinic regarding xarelto.  She said she is not going to take a blood thinner.

## 2013-12-26 ENCOUNTER — Ambulatory Visit: Payer: Self-pay | Admitting: Pharmacist

## 2014-05-16 ENCOUNTER — Ambulatory Visit (INDEPENDENT_AMBULATORY_CARE_PROVIDER_SITE_OTHER): Payer: Medicare Other | Admitting: *Deleted

## 2014-05-16 DIAGNOSIS — I480 Paroxysmal atrial fibrillation: Secondary | ICD-10-CM

## 2014-05-16 DIAGNOSIS — Z950 Presence of cardiac pacemaker: Secondary | ICD-10-CM

## 2014-05-16 DIAGNOSIS — I495 Sick sinus syndrome: Secondary | ICD-10-CM

## 2014-05-16 DIAGNOSIS — I4891 Unspecified atrial fibrillation: Secondary | ICD-10-CM

## 2014-05-16 NOTE — Progress Notes (Signed)
Pacemaker check in clinic. Normal device function. Thresholds, sensing, impedances consistent with previous measurements. Device programmed to maximize longevity. 4 ATR---longest 7sec. No high ventricular rates noted. Device programmed at appropriate safety margins. Histogram distribution appropriate for patient activity level. Device programmed to optimize intrinsic conduction. Estimated longevity 1.5126yrs. ROV w/ Dr. Johney FrameAllred in 59mo.

## 2014-05-17 LAB — MDC_IDC_ENUM_SESS_TYPE_INCLINIC
Brady Statistic RV Percent Paced: 23 %
Implantable Pulse Generator Serial Number: 151161
Lead Channel Impedance Value: 400 Ohm
Lead Channel Impedance Value: 480 Ohm
Lead Channel Pacing Threshold Amplitude: 0.6 V
Lead Channel Pacing Threshold Pulse Width: 0.4 ms
Lead Channel Sensing Intrinsic Amplitude: 2.3 mV
Lead Channel Sensing Intrinsic Amplitude: 6.6 mV
Lead Channel Setting Pacing Amplitude: 1 V
Lead Channel Setting Pacing Amplitude: 2 V
MDC IDC MSMT LEADCHNL RV PACING THRESHOLD AMPLITUDE: 0.5 V
MDC IDC MSMT LEADCHNL RV PACING THRESHOLD PULSEWIDTH: 0.4 ms
MDC IDC SESS DTM: 20150813040000
MDC IDC SET LEADCHNL RV PACING PULSEWIDTH: 0.4 ms
MDC IDC SET LEADCHNL RV SENSING SENSITIVITY: 2.5 mV
MDC IDC STAT BRADY RA PERCENT PACED: 72 %

## 2014-05-30 ENCOUNTER — Encounter: Payer: Self-pay | Admitting: Internal Medicine

## 2014-08-31 IMAGING — CR DG CHEST 2V
2 series · 2 of 2 positions shown · non-contrast
Comparison: 10/19/2012

CLINICAL DATA: Chest pain and fever.

EXAM:
CHEST  2 VIEW

[w chest pa]
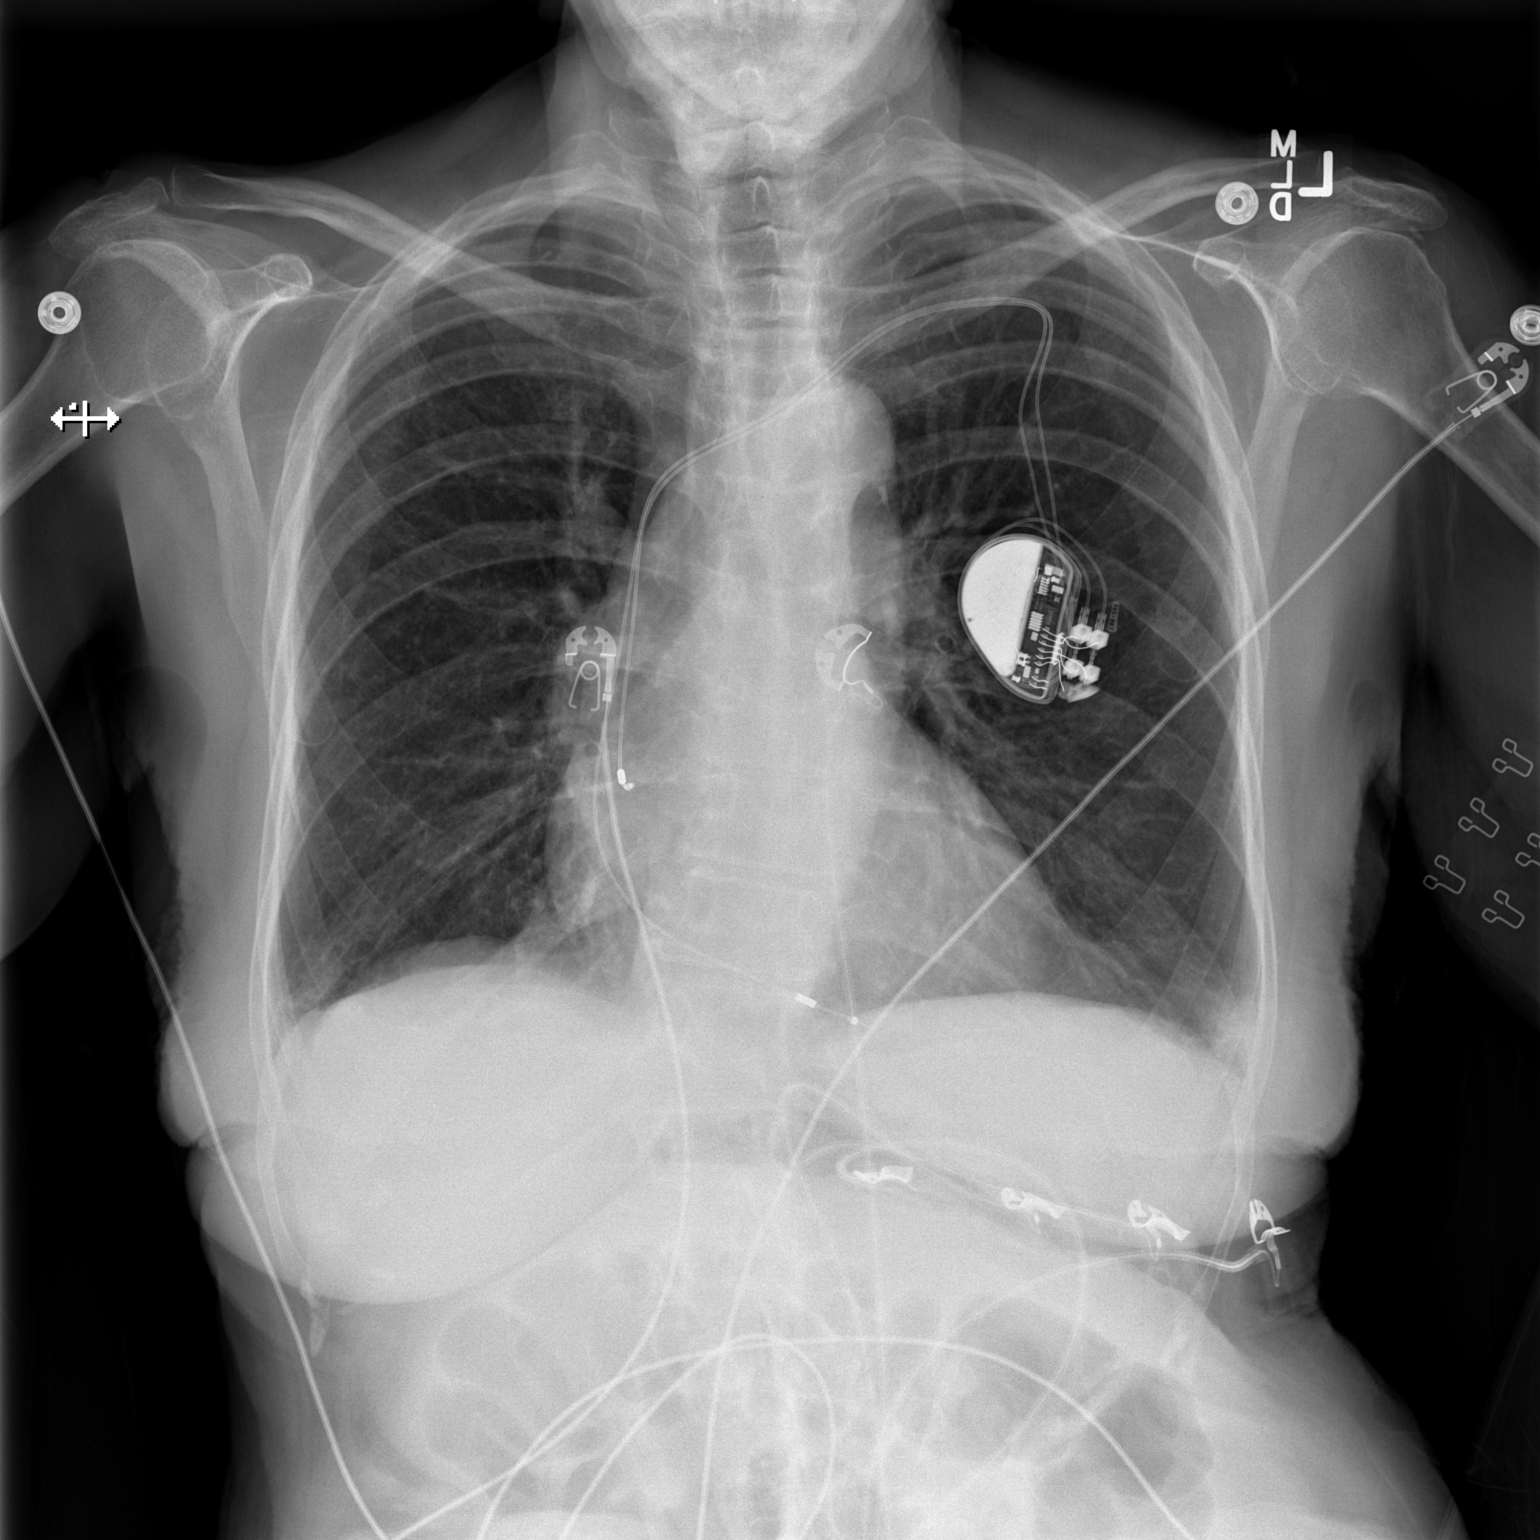

[w chest lat]
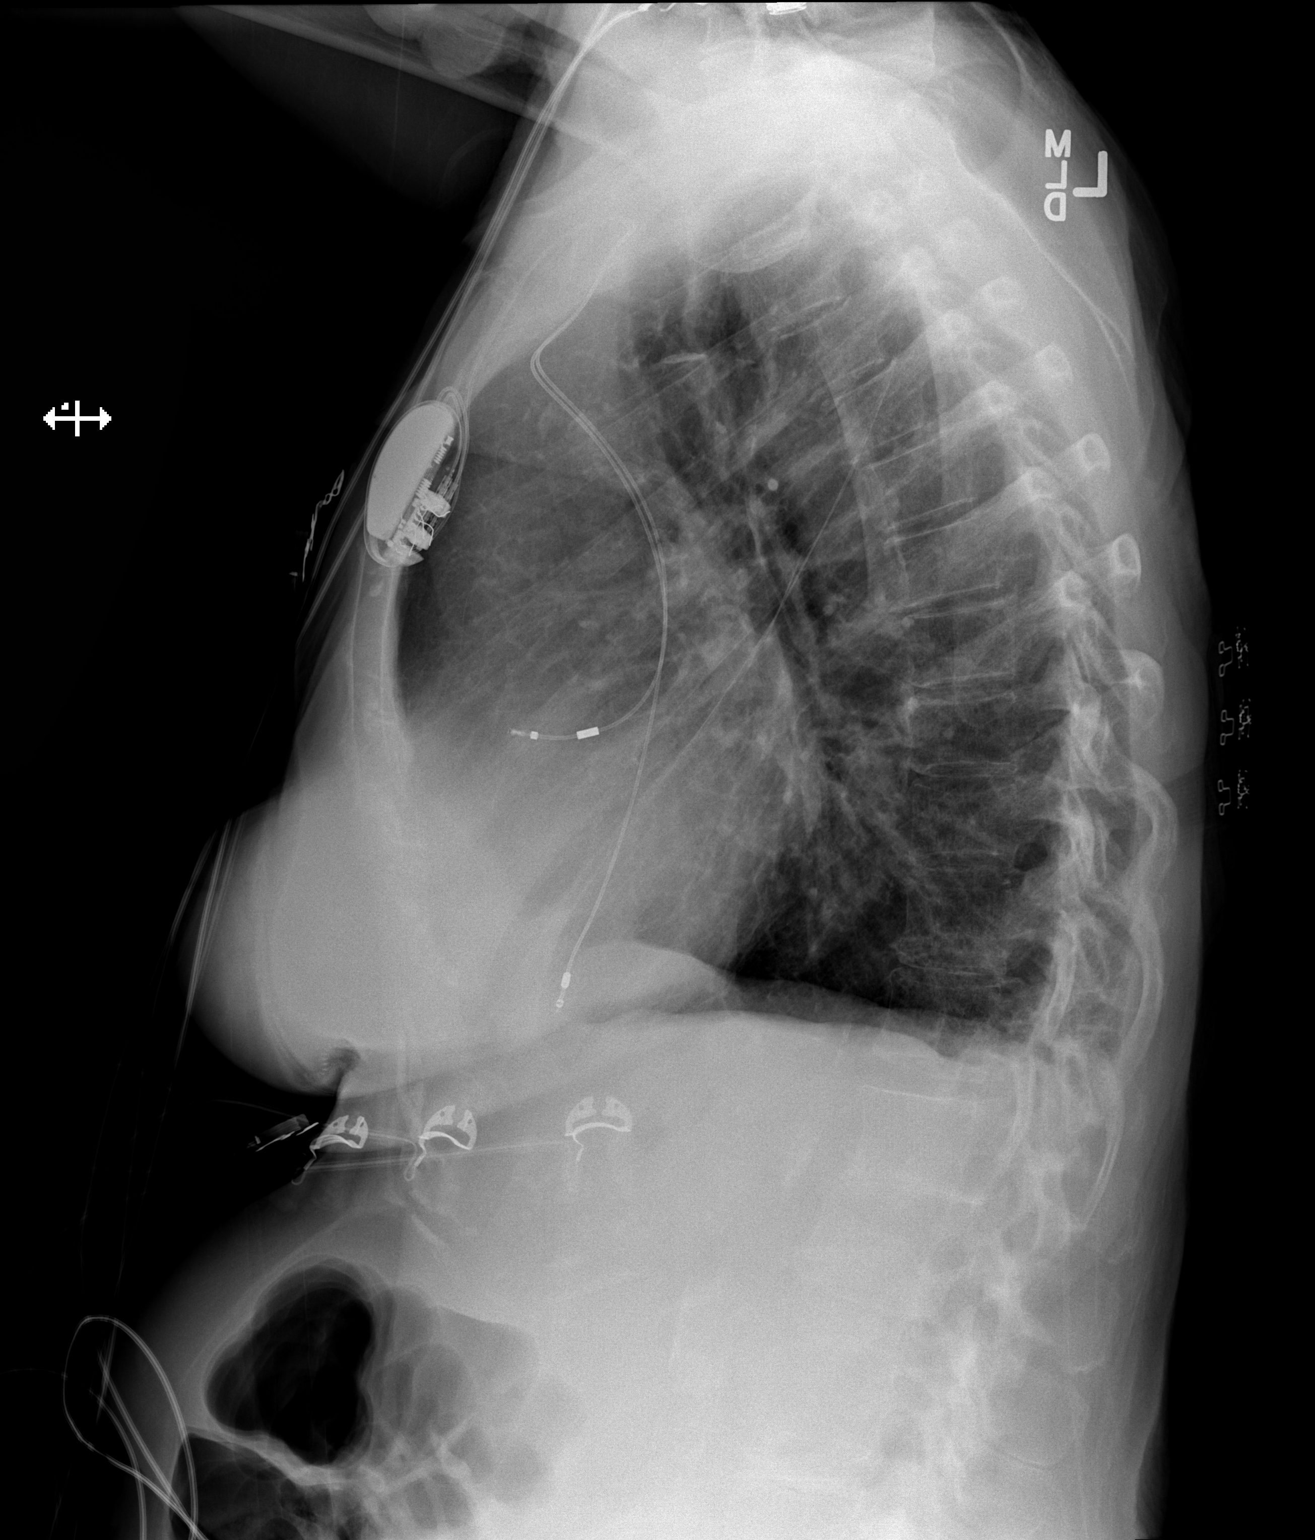

[2 of 2 positions shown; findings below may reference images not displayed]

FINDINGS: There is a left chest wall pacer device with lead in the right
atrial appendage and right ventricle. The heart size appears normal.
Small bilateral pleural effusions are identified. Pulmonary vascular
congestion without over edema is noted. The visualized osseous
structures are remarkable for scoliosis and multilevel spondylosis.
IMPRESSION: 1. Small bilateral pleural effusions and pulmonary vascular
congestion.

## 2014-08-31 IMAGING — CT CT HEAD W/O CM
2 series · 15 of 30 positions shown, 19 images · non-contrast
Comparison: None.

CLINICAL DATA: Loss of vision in the left eye.

EXAM:
CT HEAD WITHOUT CONTRAST
TECHNIQUE: Contiguous axial images were obtained from the base of the skull
through the vertex without intravenous contrast.

[Series 2: head w/o · axial · non-contrast · 0.48mm/px · z∈[-200,-75]mm · 13 of 31 slices shown, 17 images]
[im 3/31  brain]
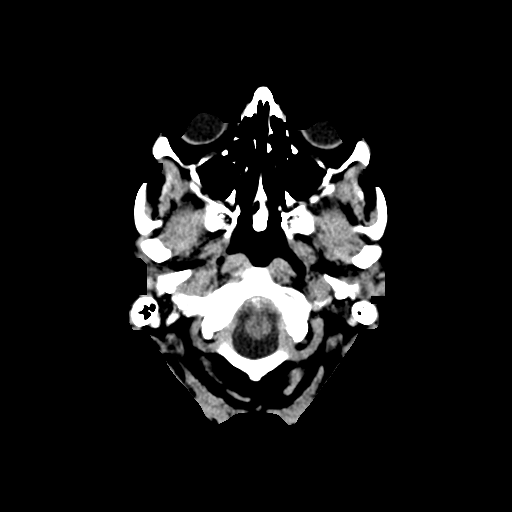
[im 3/31  bone]
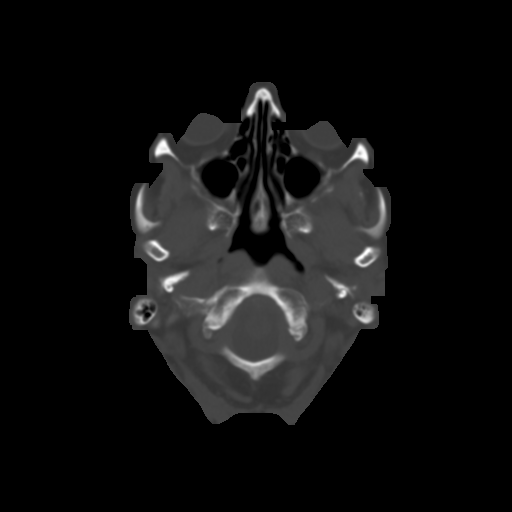
[im 5/31  brain]
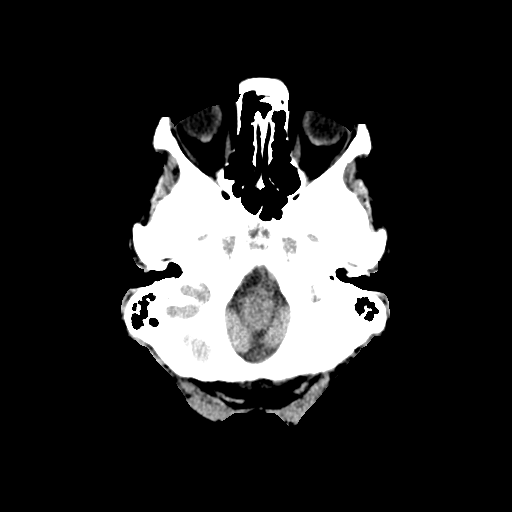
[im 7/31  brain]
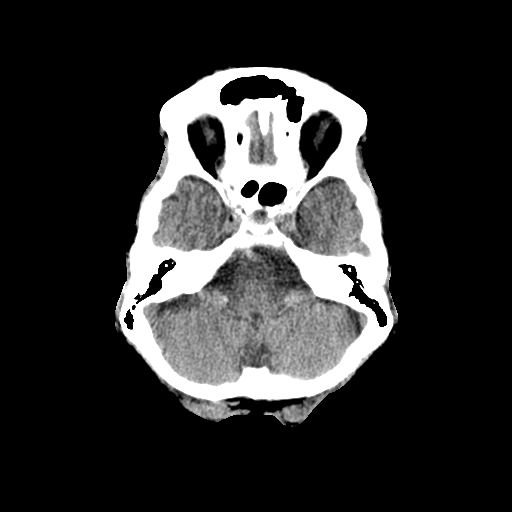
[im 9/31  brain]
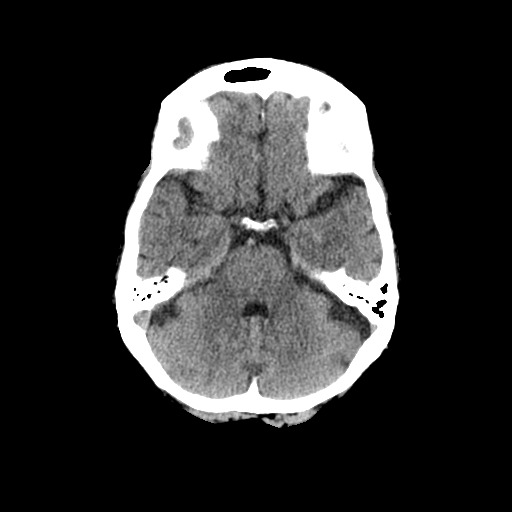
[im 11/31  brain]
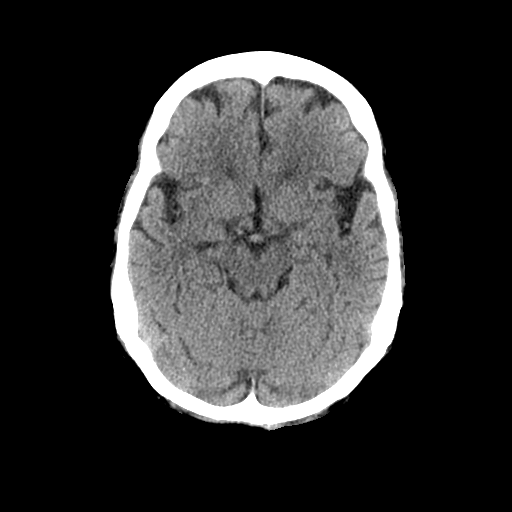
[im 11/31  bone]
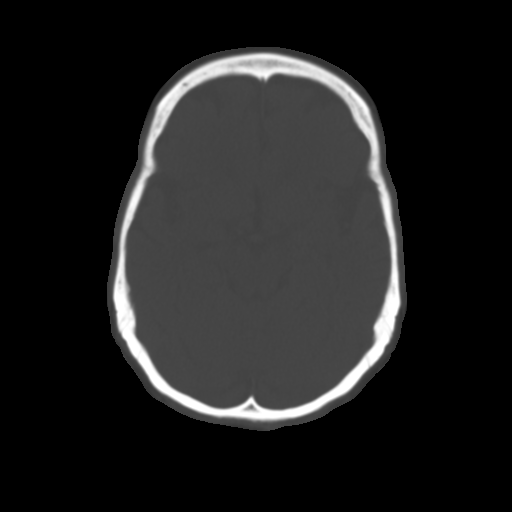
[im 13/31  brain]
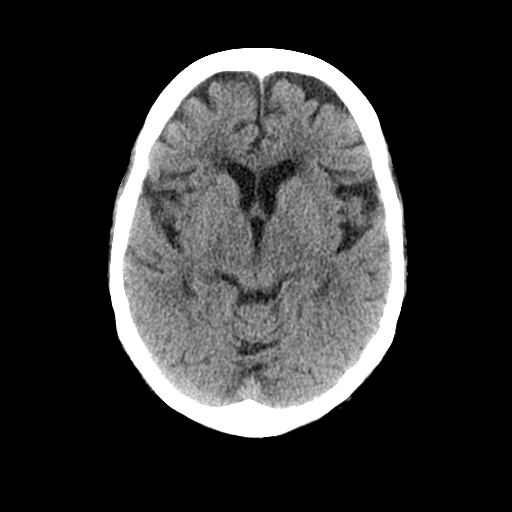
[im 16/31  brain]
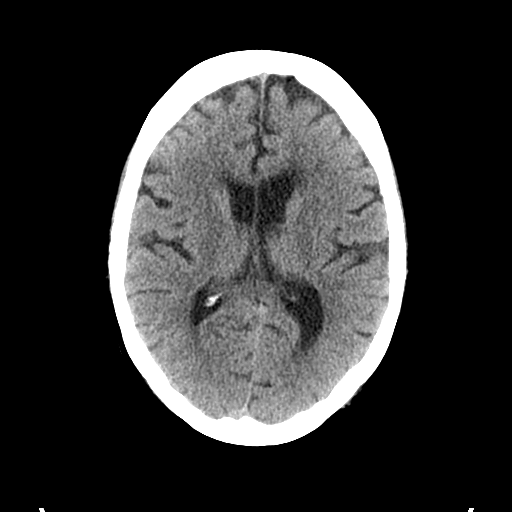
[im 18/31  brain]
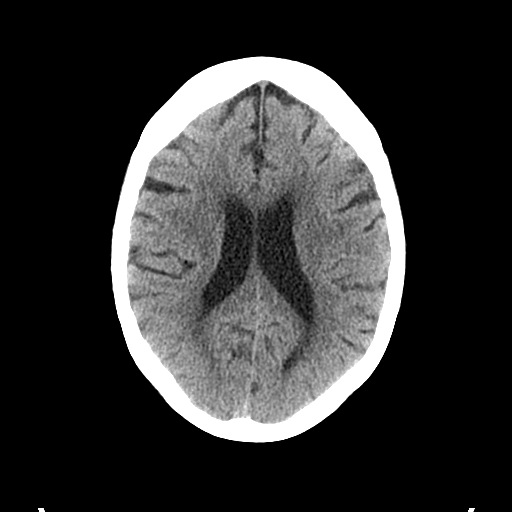
[im 20/31  brain]
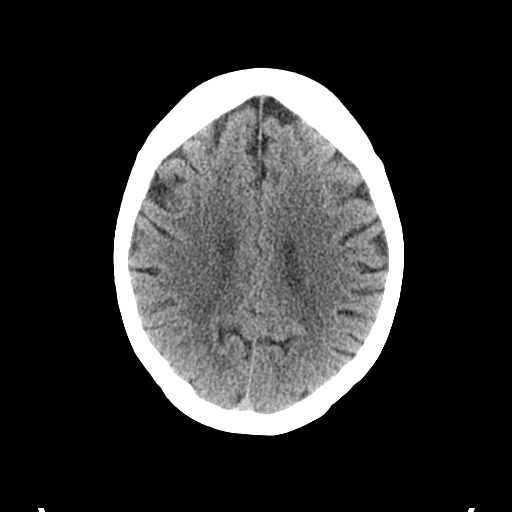
[im 20/31  bone]
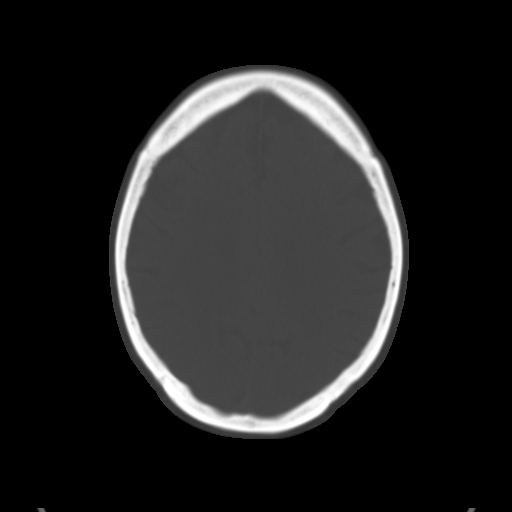
[im 22/31  brain]
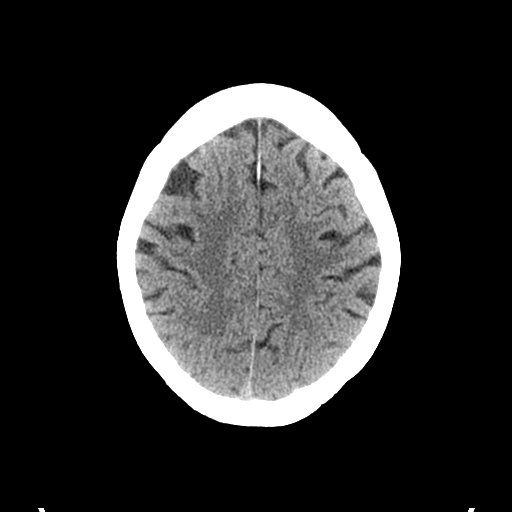
[im 24/31  brain]
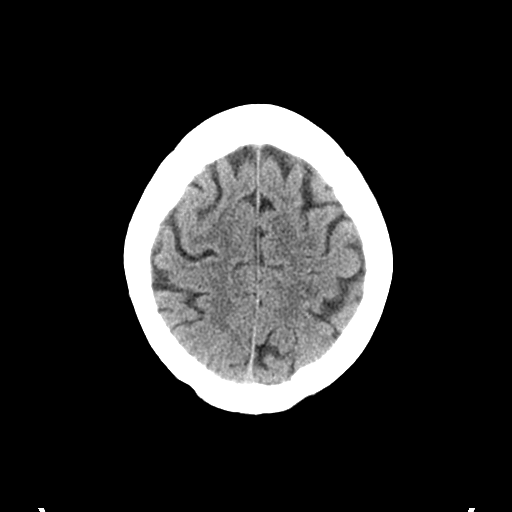
[im 26/31  brain]
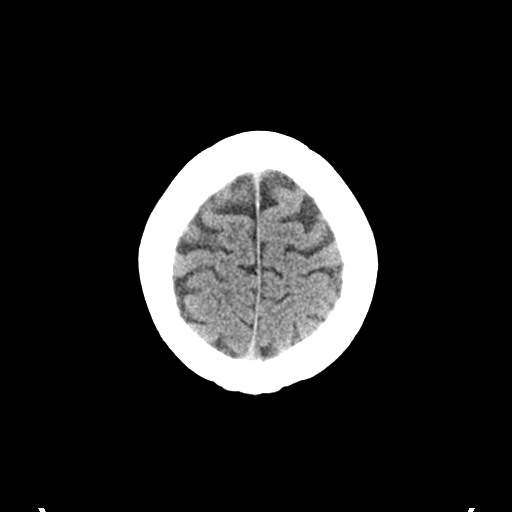
[im 28/31  brain]
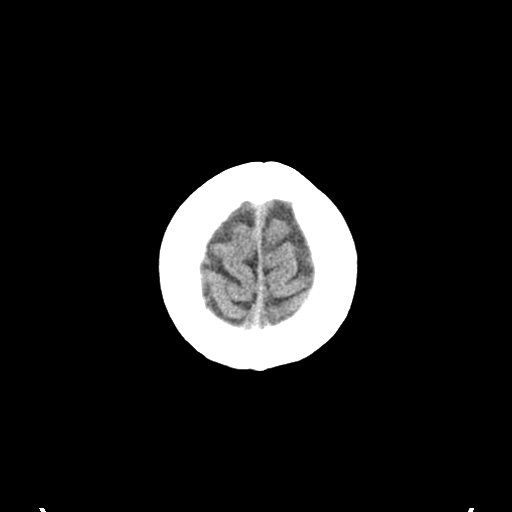
[im 28/31  bone]
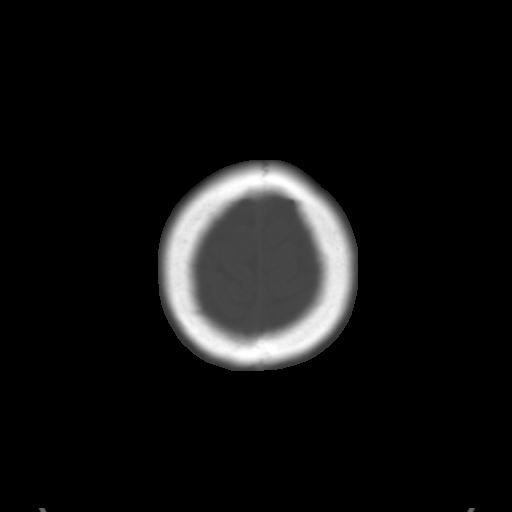

[Series 3: bone windows · axial · 0.48mm/px · z∈[-200,-180]mm · 2 of 31 slices shown]
[im 3/31  bone]
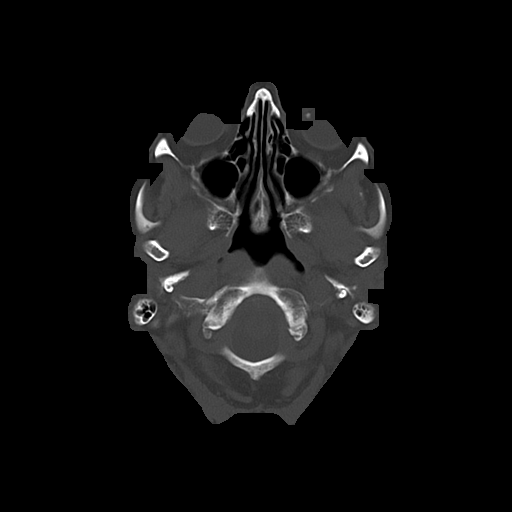
[im 7/31  bone]
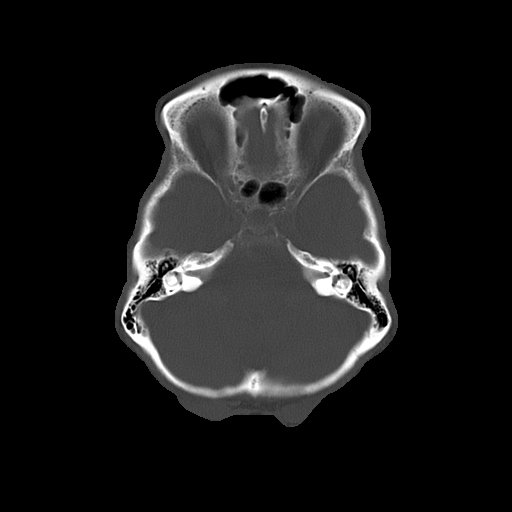

[15 of 30 positions shown; findings below may reference images not displayed]

FINDINGS: Ventricles are normal in configuration. There is ventricular and
sulcal enlargement reflecting mild, age related atrophy. No
hydrocephalus.

There are no parenchymal masses or mass effect. Patchy
periventricular white matter hypoattenuation is noted consistent
with mild chronic microvascular ischemic change. There is no
evidence of a cortical infarct.

No extra-axial masses or abnormal fluid collections.

There is no intracranial hemorrhage.

Globes and orbits are unremarkable.

Visualized sinuses and mastoid air cells are clear.
IMPRESSION: 1. No acute intracranial abnormalities.
2. No significant abnormality of either globe or orbit.
3. Mild atrophy and mild chronic microvascular ischemic change.

## 2014-12-11 ENCOUNTER — Other Ambulatory Visit: Payer: Self-pay

## 2014-12-11 ENCOUNTER — Encounter: Payer: Self-pay | Admitting: Internal Medicine

## 2014-12-12 ENCOUNTER — Encounter: Payer: Self-pay | Admitting: Internal Medicine

## 2014-12-17 ENCOUNTER — Encounter: Payer: Self-pay | Admitting: *Deleted

## 2015-01-02 ENCOUNTER — Encounter: Payer: Self-pay | Admitting: *Deleted

## 2022-09-03 DEATH — deceased
